# Patient Record
Sex: Male | Born: 1955 | Race: White | Hispanic: No | Marital: Single | Smoking: Former smoker
Health system: Southern US, Community
[De-identification: ages and names within clinical notes are randomized; demographics above are authoritative.]

## PROBLEM LIST (undated history)

## (undated) DIAGNOSIS — F329 Major depressive disorder, single episode, unspecified: Secondary | ICD-10-CM

## (undated) DIAGNOSIS — S060XAA Concussion with loss of consciousness status unknown, initial encounter: Secondary | ICD-10-CM

## (undated) DIAGNOSIS — M1990 Unspecified osteoarthritis, unspecified site: Secondary | ICD-10-CM

## (undated) DIAGNOSIS — I739 Peripheral vascular disease, unspecified: Secondary | ICD-10-CM

## (undated) DIAGNOSIS — S060X9A Concussion with loss of consciousness of unspecified duration, initial encounter: Secondary | ICD-10-CM

## (undated) DIAGNOSIS — R519 Headache, unspecified: Secondary | ICD-10-CM

## (undated) DIAGNOSIS — F419 Anxiety disorder, unspecified: Secondary | ICD-10-CM

## (undated) DIAGNOSIS — R0600 Dyspnea, unspecified: Secondary | ICD-10-CM

## (undated) DIAGNOSIS — R42 Dizziness and giddiness: Secondary | ICD-10-CM

## (undated) DIAGNOSIS — R51 Headache: Secondary | ICD-10-CM

## (undated) DIAGNOSIS — J449 Chronic obstructive pulmonary disease, unspecified: Secondary | ICD-10-CM

## (undated) DIAGNOSIS — F32A Depression, unspecified: Secondary | ICD-10-CM

## (undated) HISTORY — PX: BACK SURGERY: SHX140

## (undated) HISTORY — PX: OTHER SURGICAL HISTORY: SHX169

---

## 1998-03-20 ENCOUNTER — Inpatient Hospital Stay (HOSPITAL_COMMUNITY): Admission: EM | Admit: 1998-03-20 | Discharge: 1998-03-21 | Payer: Self-pay | Admitting: *Deleted

## 1998-03-20 ENCOUNTER — Encounter: Payer: Self-pay | Admitting: *Deleted

## 1999-06-09 ENCOUNTER — Encounter: Payer: Self-pay | Admitting: Orthopedic Surgery

## 1999-06-10 ENCOUNTER — Encounter (INDEPENDENT_AMBULATORY_CARE_PROVIDER_SITE_OTHER): Payer: Self-pay

## 1999-06-10 ENCOUNTER — Observation Stay (HOSPITAL_COMMUNITY): Admission: RE | Admit: 1999-06-10 | Discharge: 1999-06-11 | Payer: Self-pay | Admitting: Orthopedic Surgery

## 1999-06-10 ENCOUNTER — Encounter: Payer: Self-pay | Admitting: Orthopedic Surgery

## 2006-01-25 ENCOUNTER — Ambulatory Visit (HOSPITAL_BASED_OUTPATIENT_CLINIC_OR_DEPARTMENT_OTHER): Admission: RE | Admit: 2006-01-25 | Discharge: 2006-01-25 | Payer: Self-pay | Admitting: Orthopedic Surgery

## 2006-12-19 ENCOUNTER — Encounter: Admission: RE | Admit: 2006-12-19 | Discharge: 2006-12-19 | Payer: Self-pay | Admitting: Orthopedic Surgery

## 2009-06-18 ENCOUNTER — Ambulatory Visit: Payer: Self-pay | Admitting: Psychiatry

## 2010-12-05 NOTE — Op Note (Signed)
NAME:  Benjamin Jenkins, Benjamin Jenkins              ACCOUNT NO.:  [id number]   MEDICAL RECORD NO.:  [id number]          PATIENT TYPE:  AMB   LOCATION:  DSC                          FACILITY:  [facility]   PHYSICIAN:  Feliberto Gottron. Turner Daniels, M.D.   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   PROCEDURE:  Left knee partial arthroscopic medial meniscectomy for a large  bucket-handle tear, debris of chondromalacia near the notch - grade 3 with  flap tears, and removal of loose bodies, and then on the right side, we did  an intra-articular injection of 9 cubic centimeters of 0.5% Marcaine and 80  mg of Depo-Medrol.   SURGEON:  Feliberto Gottron. Turner Daniels, M.D.   FIRST ASSISTANT:  None.   ANESTHETIC:  General LMA.   ESTIMATED BLOOD LOSS:  Minimal.   FLUID REPLACEMENT:  800 cubic centimeters of crystalloid.   DRAINS PLACED:  None.   TOURNIQUET TIME:  None.   INDICATIONS FOR PROCEDURE:  A 55 year old man with catching, popping, and  pain in his left knee.  MRI scan is consistent with a large bucket-handle  tear of the medial meniscus and some chondromalacia.  He has failed  conservative treatment, desires elective arthroscopic evaluation and  treatment of his left knee, and has some chondromalacia in his right knee  and subsequently wants a cortisone shot in the right knee while he is under  the anesthetic and, of course, this is reasonable.  The risks and benefits  of surgery were discussed, questions answered.  He is prepared for  intervention.   DESCRIPTION OF PROCEDURE:  The patient identified by armband, taken the  operating room at [facility].  Appropriate anesthetic monitors  were attached and  general LMA anesthesia induced with the patient in the  supine position.  The lateral aspect of the right knee was then prepped with  alcohol and we injected 9 cubic centimeters of 0.5% Marcaine and 80 mg of  Depo-Medrol intra-articularly into the right knee.  A lateral post was then  applied to the table and the left lower extremity prepped and draped in the  usual sterile fashion from the ankle to the mid thigh, and we began the  procedure on the left side by making standard inferomedial and inferolateral  peripatellar incisions, allowing introduction the arthroscope,  the  inferolateral portal, and the outflow through the inferomedial portal.  The  suprapatellar pouch, patella, and trochlea had some very mild grade 2  chondromalacia incidentally lightly debrided.  The medial gutter was then  cleared.  Moving on the medial side of the knee, we found a large bucket-  handle tear the medial meniscus.  This was removed piecemeal with a 3.5  gator sucker shaver, a 4.2 great white sucker shaver, and straight baskets.  The ACL and PCL were intact  and there were multiple cartilaginous loose  bodies removed as well.  Grade 3 chondromalacia with flap tears of the  medial femoral condyle near the notch was identified and debrided.  The  lateral compartment was in excellent condition and the gutters were cleared  medially and laterally.  At this point, the portals were infiltrated with 2-  3 cubic centimeters of 0.5% Marcaine and another 12 cubic centimeters placed  into the joint itself using a 22-gauge needle.  The arthroscopic instruments  were removed.  A dressing of Xeroform 4x4 dressing sponges, Webril, and an  Ace wrap applied.  The patient was then awakened and taken to the recovery  room without difficulty.      Feliberto Gottron. Turner Daniels, M.D.  Electronically Signed     FJR/MEDQ  D:  01/25/2006  T:  01/25/2006  Job:  [id number]

## 2013-04-14 ENCOUNTER — Emergency Department (HOSPITAL_COMMUNITY)
Admission: EM | Admit: 2013-04-14 | Discharge: 2013-04-14 | Disposition: A | Discharge status: Home / Self Care | Payer: BC Managed Care – PPO | Attending: Emergency Medicine | Admitting: Emergency Medicine

## 2013-04-14 DIAGNOSIS — Z791 Long term (current) use of non-steroidal anti-inflammatories (NSAID): Secondary | ICD-10-CM | POA: Insufficient documentation

## 2013-04-14 DIAGNOSIS — R42 Dizziness and giddiness: Secondary | ICD-10-CM | POA: Insufficient documentation

## 2013-04-14 DIAGNOSIS — Z79899 Other long term (current) drug therapy: Secondary | ICD-10-CM | POA: Insufficient documentation

## 2013-04-14 DIAGNOSIS — H8110 Benign paroxysmal vertigo, unspecified ear: Secondary | ICD-10-CM | POA: Insufficient documentation

## 2013-04-14 LAB — POCT I-STAT, CHEM 8
Chloride: 100 mEq/L (ref 96–112)
Creatinine, Ser: 1 mg/dL (ref 0.50–1.35)
HCT: 41 % (ref 39.0–52.0)
Hemoglobin: 13.9 g/dL (ref 13.0–17.0)
Potassium: 3.7 mEq/L (ref 3.5–5.1)
Sodium: 135 mEq/L (ref 135–145)
TCO2: 24 mmol/L (ref 0–100)

## 2013-04-14 LAB — CBC
HCT: 39.1 % (ref 39.0–52.0)
Hemoglobin: 14.1 g/dL (ref 13.0–17.0)
MCHC: 36.1 g/dL — ABNORMAL HIGH (ref 30.0–36.0)
RBC: 4.2 MIL/uL — ABNORMAL LOW (ref 4.22–5.81)
RDW: 12.3 % (ref 11.5–15.5)
WBC: 5.9 10*3/uL (ref 4.0–10.5)

## 2013-04-14 MED ORDER — MECLIZINE HCL 25 MG PO TABS: 25.0000 mg | ORAL_TABLET | Freq: Three times a day (TID) | ORAL | Status: DC | PRN

## 2013-04-14 NOTE — ED Notes (Signed)
Per EMS: Pt from home with c/o with sudden onset of dizziness, lightheaded at 1330 today with standing. PT AO x4. NSR. CBG 99. 148/94.

## 2013-04-14 NOTE — ED Notes (Signed)
Pt reports dizziness, lightheadedness starting at 1330 today while driving. Worse when standing or moving head from side to side. Denies at this time. Neuro intact. Denies pain.

## 2013-04-14 NOTE — ED Provider Notes (Signed)
CSN: [id number]     Arrival date & time 04/14/13  1633 History   First MD Initiated Contact with Patient 04/14/13 1646     Chief Complaint  Patient presents with  . Near Syncope   (Consider location/radiation/quality/duration/timing/severity/associated sxs/prior Treatment) HPI Comments: 57 yo male with intermittent vertigo since 1330 today, only with head movement.  No hx of similar.  No ha or neck pain.  No neck surgeries.  Sxs resolved in ED.  PT feels normal again.  No stroke hx.  Lasted minutes at a time, intermittent.   The history is provided by the patient.    No past medical history on file. No past surgical history on file. No family history on file. History  Substance Use Topics  . Smoking status: Not on file  . Smokeless tobacco: Not on file  . Alcohol Use: Not on file    Review of Systems  Constitutional: Negative for fever and chills.  HENT: Negative for neck pain and neck stiffness.   Eyes: Negative for visual disturbance.  Respiratory: Negative for shortness of breath.   Cardiovascular: Negative for chest pain.  Gastrointestinal: Negative for vomiting and abdominal pain.  Genitourinary: Negative for dysuria and flank pain.  Musculoskeletal: Negative for back pain.  Skin: Negative for rash.  Neurological: Positive for dizziness. Negative for weakness, light-headedness, numbness and headaches.    Allergies  Review of patient's allergies indicates no known allergies.  Home Medications   Current Outpatient Rx  Name  Route  Sig  Dispense  Refill  . loratadine (CLARITIN) 10 MG tablet   Oral   Take 10 mg by mouth daily.         . meloxicam (MOBIC) 15 MG tablet   Oral   Take 15 mg by mouth daily.         . meclizine (ANTIVERT) 25 MG tablet   Oral   Take 1 tablet (25 mg total) by mouth 3 (three) times daily as needed.   10 tablet   0    BP 136/78  Pulse 66  Temp(Src) 97.2 F (36.2 C) (Oral)  Resp 14  SpO2 98% Physical Exam  Nursing note and  vitals reviewed. Constitutional: He is oriented to person, place, and time. He appears well-developed and well-nourished.  HENT:  Head: Normocephalic and atraumatic.  Eyes: Conjunctivae are normal. Lids are everted and swept, no foreign bodies found. Right eye exhibits no discharge. Left eye exhibits no discharge.  Neck: Normal range of motion. Neck supple. No tracheal deviation present.  Cardiovascular: Normal rate and regular rhythm.   Pulmonary/Chest: Effort normal and breath sounds normal.  Abdominal: Soft. He exhibits no distension. There is no tenderness. There is no guarding.  Musculoskeletal: He exhibits no edema.  Neurological: He is alert and oriented to person, place, and time.  5+ strength in UE and LE with f/e at major joints. Sensation to palpation intact in UE and LE. CNs 2-12 grossly intact.  EOMFI.  PERRL.   Finger nose and coordination intact bilateral.   Visual fields intact to finger testing.   Skin: Skin is warm. No rash noted.  Psychiatric: He has a normal mood and affect.    ED Course  Procedures (including critical care time) Labs Review Labs Reviewed  CBC - Abnormal; Notable for the following:    RBC 4.20 (*)    MCHC 36.1 (*)    All other components within normal limits  POCT I-STAT, CHEM 8 - Abnormal; Notable for the following:  Glucose, Bld 121 (*)    All other components within normal limits   Imaging Review No results found.  MDM   1. BPPV (benign paroxysmal positional vertigo)    Well appearing. HPI consistent with bppv, low risk cardiac and stroke.  No sxs in ed.  EKG done at triage.  Reviewed, lvh. Close fup discussed.  Date: 04/14/2013  Rate: 61  Rhythm: normal sinus rhythm  QRS Axis: normal  Intervals: normal  ST/T Wave abnormalities: nonspecific ST changes  Conduction Disutrbances:?LVH  Narrative Interpretation:   Old EKG Reviewed:unchanged DC    Enid Skeens, MD 04/14/13 [phone]

## 2013-05-12 ENCOUNTER — Ambulatory Visit: Payer: BC Managed Care – PPO

## 2014-11-09 ENCOUNTER — Other Ambulatory Visit: Payer: Self-pay | Admitting: Family Medicine

## 2014-11-09 DIAGNOSIS — M545 Low back pain: Secondary | ICD-10-CM

## 2015-10-11 ENCOUNTER — Other Ambulatory Visit: Payer: Self-pay | Admitting: Orthopedic Surgery

## 2015-10-11 DIAGNOSIS — M25521 Pain in right elbow: Secondary | ICD-10-CM

## 2015-10-18 ENCOUNTER — Ambulatory Visit
Admission: RE | Admit: 2015-10-18 | Discharge: 2015-10-18 | Disposition: A | Discharge status: Home / Self Care | Payer: BLUE CROSS/BLUE SHIELD | Source: Ambulatory Visit | Attending: Orthopedic Surgery | Admitting: Orthopedic Surgery

## 2015-10-18 DIAGNOSIS — M25521 Pain in right elbow: Secondary | ICD-10-CM

## 2016-07-21 ENCOUNTER — Ambulatory Visit (INDEPENDENT_AMBULATORY_CARE_PROVIDER_SITE_OTHER): Payer: 59

## 2016-07-21 ENCOUNTER — Ambulatory Visit (INDEPENDENT_AMBULATORY_CARE_PROVIDER_SITE_OTHER): Payer: 59 | Admitting: Physician Assistant

## 2016-07-21 VITALS — BP 138/70 | HR 77 | Temp 98.1°F | Resp 16 | Ht 69.5 in | Wt 138.2 lb

## 2016-07-21 DIAGNOSIS — R938 Abnormal findings on diagnostic imaging of other specified body structures: Secondary | ICD-10-CM

## 2016-07-21 DIAGNOSIS — R05 Cough: Secondary | ICD-10-CM

## 2016-07-21 DIAGNOSIS — R9389 Abnormal findings on diagnostic imaging of other specified body structures: Secondary | ICD-10-CM

## 2016-07-21 DIAGNOSIS — R059 Cough, unspecified: Secondary | ICD-10-CM

## 2016-07-21 DIAGNOSIS — R197 Diarrhea, unspecified: Secondary | ICD-10-CM | POA: Diagnosis not present

## 2016-07-21 LAB — POCT CBC
Granulocyte percent: 79.8 %G (ref 37–80)
HCT, POC: 47.1 % (ref 43.5–53.7)
Hemoglobin: 16.5 g/dL (ref 14.1–18.1)
LYMPH, POC: 1.4 (ref 0.6–3.4)
MCH: 34.2 pg — AB (ref 27–31.2)
MCHC: 35 g/dL (ref 31.8–35.4)
MCV: 97.6 fL — AB (ref 80–97)
MID (cbc): 0.7 (ref 0–0.9)
MPV: 6.9 fL (ref 0–99.8)
POC Granulocyte: 8.4 — AB (ref 2–6.9)
POC LYMPH PERCENT: 13.6 %L (ref 10–50)
POC MID %: 6.6 %M (ref 0–12)
Platelet Count, POC: 286 10*3/uL (ref 142–424)
RBC: 4.83 M/uL (ref 4.69–6.13)
RDW, POC: 12.5 %
WBC: 10.5 10*3/uL — AB (ref 4.6–10.2)

## 2016-07-21 MED ORDER — ALBUTEROL SULFATE (2.5 MG/3ML) 0.083% IN NEBU
2.5000 mg | INHALATION_SOLUTION | Freq: Once | RESPIRATORY_TRACT | Status: AC
  Administered 2016-07-21: 2.5 mg via RESPIRATORY_TRACT

## 2016-07-21 MED ORDER — HYDROCOD POLST-CPM POLST ER 10-8 MG/5ML PO SUER: 5.0000 mL | Freq: Two times a day (BID) | ORAL | 0 refills | Status: DC | PRN

## 2016-07-21 MED ORDER — AZITHROMYCIN 250 MG PO TABS: ORAL_TABLET | ORAL | 0 refills | Status: DC

## 2016-07-21 MED ORDER — IPRATROPIUM BROMIDE 0.02 % IN SOLN
0.5000 mg | Freq: Once | RESPIRATORY_TRACT | Status: AC
  Administered 2016-07-21: 0.5 mg via RESPIRATORY_TRACT

## 2016-07-21 MED ORDER — ALBUTEROL SULFATE HFA 108 (90 BASE) MCG/ACT IN AERS: 2.0000 | INHALATION_SPRAY | Freq: Four times a day (QID) | RESPIRATORY_TRACT | 0 refills | Status: DC | PRN

## 2016-07-21 MED ORDER — BENZONATATE 100 MG PO CAPS: 100.0000 mg | ORAL_CAPSULE | Freq: Three times a day (TID) | ORAL | 0 refills | Status: DC | PRN

## 2016-07-21 MED ORDER — IPRATROPIUM BROMIDE HFA 17 MCG/ACT IN AERS: 2.0000 | INHALATION_SPRAY | Freq: Four times a day (QID) | RESPIRATORY_TRACT | 0 refills | Status: DC | PRN

## 2016-07-21 MED ORDER — PREDNISONE 20 MG PO TABS: 40.0000 mg | ORAL_TABLET | Freq: Every day | ORAL | 0 refills | Status: AC

## 2016-07-21 NOTE — Patient Instructions (Addendum)
We will cover for bacterial etiology of your cough with a zpack.  I am also concerned that you may have underlying COPD due to your chest xray findings. I am going to refer you to pulmonology and also treat this as a COPD exacerbation. We will use a course of prednisone and two inhalers along with your antibiotic. Return to clinic or ER if symptoms worsen, do not improve in 5-7 days, or as needed    Cough, Adult Introduction A cough helps to clear your throat and lungs. A cough may last only 2-3 weeks (acute), or it may last longer than 8 weeks (chronic). Many different things can cause a cough. A cough may be a sign of an illness or another medical condition. Follow these instructions at home:  Pay attention to any changes in your cough.  Take medicines only as told by your doctor.  If you were prescribed an antibiotic medicine, take it as told by your doctor. Do not stop taking it even if you start to feel better.  Talk with your doctor before you try using a cough medicine.  Drink enough fluid to keep your pee (urine) clear or pale yellow.  If the air is dry, use a cold steam vaporizer or humidifier in your home.  Stay away from things that make you cough at work or at home.  If your cough is worse at night, try using extra pillows to raise your head up higher while you sleep.  Do not smoke, and try not to be around smoke. If you need help quitting, ask your doctor.  Do not have caffeine.  Do not drink alcohol.  Rest as needed. Contact a doctor if:  You have new problems (symptoms).  You cough up yellow fluid (pus).  Your cough does not get better after 2-3 weeks, or your cough gets worse.  Medicine does not help your cough and you are not sleeping well.  You have pain that gets worse or pain that is not helped with medicine.  You have a fever.  You are losing weight and you do not know why.  You have night sweats. Get help right away if:  You cough up  blood.  You have trouble breathing.  Your heartbeat is very fast. This information is not intended to replace advice given to you by your health care provider. Make sure you discuss any questions you have with your health care provider. Document Released: 03/19/2011 Document Revised: 12/12/2015 Document Reviewed: 09/12/2014  2017 Elsevier    IF you received an x-ray today, you will receive an invoice from [facility]. Please contact [facility] at [phone] with questions or concerns regarding your invoice.   IF you received labwork today, you will receive an invoice from [facility]. Please contact [facility] at [phone] with questions or concerns regarding your invoice.   Our billing staff will not be able to assist you with questions regarding bills from these companies.  You will be contacted with the lab results as soon as they are available. The fastest way to get your results is to activate your My Chart account. Instructions are located on the last page of this paperwork. If you have not heard from us regarding the results in 2 weeks, please contact this office.

## 2016-07-21 NOTE — Progress Notes (Signed)
MRN: [id number] DOB: 11/11/1955  Subjective:   Benjamin Jenkins is a 61 y.o. male presenting for chief complaint of Cough (x 2 wks); Sinusitis (x 2 wks); Sore Throat (x 2wks); Epistaxis (when blowing his nose); and Diarrhea . Reports 3 week history of sinus headache, sinus congestion, rhinorrhea, ear fullness, productive cough (no hemoptysis), wheezing and shortness of breath, night sweats, chills and diarrhea. His cough has gotten progressively worse this past week. Has tried cough syrup with expectorant with no relief. Denies sore throat, chest pain and myalgia, nausea, vomiting and abdominal pain. Has had some sick contact with coworkers. Has history of seasonal allergies, no history of asthma or COPD. Patient has had flu shot this season. Denies current smoking. Quit in 1990s.  Drinks 3 beers daily. Denies any other aggravating or relieving factors, no other questions or concerns.  Review of Systems  Constitutional: Negative for malaise/fatigue and weight loss.  Eyes: Negative for pain and discharge.  Cardiovascular: Negative for palpitations and leg swelling.  Gastrointestinal: Negative for blood in stool, constipation and melena.  Genitourinary: Negative for dysuria and frequency.  Musculoskeletal: Negative for joint pain and myalgias.  Skin: Negative for rash.  Neurological: Positive for dizziness ( intermittent, not present today).  Endo/Heme/Allergies: Positive for environmental allergies.   [name] has a current medication list which includes the following prescription(s): acetaminophen and fluticasone propionate. Also has No Known Allergies.  [name]  has no past medical history on file. Also  has no past surgical history on file.    Social History   Social History  . Marital status: Single    Spouse name: N/A  . Number of children: N/A  . Years of education: N/A   Occupational History  . Not on file.   Social History Main Topics  . Smoking status: Former Smoker   Packs/day: 0.50    Years: 20.00    Types: Cigarettes  . Smokeless tobacco: Former NeurosurgeonUser    Quit date: 07/21/1988  . Alcohol use Not on file  . Drug use: Unknown  . Sexual activity: Not on file   Other Topics Concern  . Not on file   Social History Narrative  . No narrative on file    Objective:   Vitals: BP 138/70   Pulse 77   Temp 98.1 F (36.7 C) (Oral)   Resp 16   Ht 5' 9.5" (1.765 m)   Wt 138 lb 3.2 oz (62.7 kg)   SpO2 96%   PF 300 L/min   BMI 20.12 kg/m   Physical Exam  Constitutional: He is oriented to person, place, and time. He appears well-developed and well-nourished. He appears distressed (moderate, consistently coughing throughout exam).  HENT:  Head: Normocephalic and atraumatic.  Right Ear: External ear and ear canal normal. No tenderness. Tympanic membrane is erythematous (  mild) and bulging (  mild).  Left Ear: External ear and ear canal normal. No tenderness. Tympanic membrane is erythematous ( mild) and bulging (  mild).  Nose: Rhinorrhea present. Right sinus exhibits no maxillary sinus tenderness and no frontal sinus tenderness. Left sinus exhibits no maxillary sinus tenderness and no frontal sinus tenderness.  Mouth/Throat: Uvula is midline and mucous membranes are normal. Posterior oropharyngeal erythema present. No tonsillar exudate.  Eyes: Conjunctivae are normal.  Neck: Normal range of motion.  Cardiovascular: Normal rate, regular rhythm and normal heart sounds.   Pulmonary/Chest: Effort normal.  Lung exam difficult due to excessive coughing by patient. Rhonchi auscultated on posterior lung  fields, cleared with cough. Minimal expiratory  wheezing auscultated.    Barrel chest noted.   Abdominal: Soft. Normal appearance and bowel sounds are normal. There is no tenderness.  Lymphadenopathy:       Head (right side): No submental, no submandibular, no tonsillar, no preauricular, no posterior auricular and no occipital adenopathy present.       Head  (left side): No submental, no submandibular, no tonsillar, no preauricular, no posterior auricular and no occipital adenopathy present.    He has no cervical adenopathy.       Right: No supraclavicular adenopathy present.       Left: No supraclavicular adenopathy present.  Neurological: He is alert and oriented to person, place, and time.  Skin: Skin is warm and dry.  Psychiatric: He has a normal mood and affect.  Vitals reviewed.   Results for orders placed or performed in visit on 07/21/16 (from the past 24 hour(s))  POCT CBC     Status: Abnormal   Collection Time: 07/21/16 10:32 AM  Result Value Ref Range   WBC 10.5 (A) 4.6 - 10.2 K/uL   Lymph, poc 1.4 0.6 - 3.4   POC LYMPH PERCENT 13.6 10 - 50 %L   MID (cbc) 0.7 0 - 0.9   POC MID % 6.6 0 - 12 %M   POC Granulocyte 8.4 (A) 2 - 6.9   Granulocyte percent 79.8 37 - 80 %G   RBC 4.83 4.69 - 6.13 M/uL   Hemoglobin 16.5 14.1 - 18.1 g/dL   HCT, POC 16.1 09.6 - 53.7 %   MCV 97.6 (A) 80 - 97 fL   MCH, POC 34.2 (A) 27 - 31.2 pg   MCHC 35.0 31.8 - 35.4 g/dL   RDW, POC 04.5 %   Platelet Count, POC 286 142 - 424 K/uL   MPV 6.9 0 - 99.8 fL    Dg Chest 2 View  Result Date: 07/21/2016 CLINICAL DATA:  Cough for 3 weeks. EXAM: CHEST  2 VIEW COMPARISON:  05/20/2010 FINDINGS: Heart size and pulmonary vascularity are normal. Tortuosity of the thoracic aorta. No infiltrates or effusions. The lungs appear somewhat hyperinflated which could be due to emphysema or a deep inspiration. IMPRESSION: Hyperinflated lungs. Tortuous aorta. Does the patient have hypertension? Otherwise normal exam. Electronically Signed   By: Francene Boyers M.D.   On: 07/21/2016 10:10   Peak flow reading pre breathing treatment was 275, post breathing treatment is 300 , about 57% of predicted. Pt reports relief with breathing treatment.   Assessment and Plan :  1. Diarrhea, unspecified type - Stool culture  2. Cough Question underlying COPD due to physical exam findings,  smoking history, and CXR findings. Will treat as COPD exacerbation and give pt a pulmonology referral for further workup. Pt instructed to return to clinic if symptoms worsen, do not improve in 5-7 days, or as needed - POCT CBC - albuterol (PROVENTIL) (2.5 MG/3ML) 0.083% nebulizer solution 2.5 mg; Take 3 mLs (2.5 mg total) by nebulization once. - ipratropium (ATROVENT) nebulizer solution 0.5 mg; Take 2.5 mLs (0.5 mg total) by nebulization once. - DG Chest 2 View; Future - azithromycin (ZITHROMAX) 250 MG tablet; Take 2 tabs PO x 1 dose, then 1 tab PO QD x 4 days  Dispense: 6 tablet; Refill: 0 - chlorpheniramine-HYDROcodone (TUSSIONEX PENNKINETIC ER) 10-8 MG/5ML SUER; Take 5 mLs by mouth every 12 (twelve) hours as needed for cough.  Dispense: 100 mL; Refill: 0 - benzonatate (TESSALON) 100 MG capsule; Take  1-2 capsules (100-200 mg total) by mouth 3 (three) times daily as needed for cough.  Dispense: 40 capsule; Refill: 0 - albuterol (PROVENTIL HFA;VENTOLIN HFA) 108 (90 Base) MCG/ACT inhaler; Inhale 2 puffs into the lungs every 6 (six) hours as needed for wheezing or shortness of breath.  Dispense: 1 Inhaler; Refill: 0 - Ambulatory referral to Pulmonology - ipratropium (ATROVENT HFA) 17 MCG/ACT inhaler; Inhale 2 puffs into the lungs every 6 (six) hours as needed for wheezing.  Dispense: 1 Inhaler; Refill: 0 - predniSONE (DELTASONE) 20 MG tablet; Take 2 tablets (40 mg total) by mouth daily with breakfast.  Dispense: 10 tablet; Refill: 0  3. Abnormal chest x-ray -Due to CXR findings of tortuous aorta with no PMH of HTN or heart disease, will refer to cardio for further workup.  - Ambulatory referral to Cardiology  Benjiman Core, PA-C  [facility] 07/21/2016 10:35 AM

## 2016-07-23 ENCOUNTER — Telehealth: Payer: Self-pay

## 2016-07-23 NOTE — Telephone Encounter (Signed)
PATIENT WAS SEEN ON Tuesday 07/21/16 BY BRITTANY WISEMAN FOR A COUGH. SHE GAVE HIM AN OUT OF WORK NOTE TO RETURN BACK TO WORK ON Friday 07/24/2016. HE SAID HE IS STILL NOT SLEEPING WELL DUE TO THE COUGH AND HE STILL HAS A HEADACHE. THE MEDICINE IS HELPING SOME BUT HE WOULD LIKE TO GET A NEW NOTE TO RETURN TO WORK ON Monday 07/27/2016. BEST PHONE [phone] (CELL)  [facility]

## 2016-07-24 ENCOUNTER — Ambulatory Visit (INDEPENDENT_AMBULATORY_CARE_PROVIDER_SITE_OTHER): Payer: 59 | Admitting: Physician Assistant

## 2016-07-24 VITALS — BP 128/80 | HR 92 | Temp 98.5°F | Resp 17 | Ht 69.5 in | Wt 138.0 lb

## 2016-07-24 DIAGNOSIS — R05 Cough: Secondary | ICD-10-CM

## 2016-07-24 DIAGNOSIS — R059 Cough, unspecified: Secondary | ICD-10-CM

## 2016-07-24 NOTE — Telephone Encounter (Signed)
Pt is currently in office

## 2016-07-24 NOTE — Telephone Encounter (Signed)
That is perfectly okay! If he starts to feel worse though please tell him to come back. Can you please send me a note? Thanks !

## 2016-07-24 NOTE — Progress Notes (Signed)
    MRN: [id number] DOB: [birth year]  Subjective:   Benjamin Jenkins is a 61 y.o. male presenting for chief complaint of Follow-up (cough congestion ) Pt initially seen on 07/21/16 questionable underlying mild COPD exacerbation. Pt treated with breathing treatment in office and given albuterol and atrovent inhalers, prednisone taper, zpack, and symptomatic treatment for cough. He was also given referral to pulmonology and cardiology due to CXR findings of hyperinflation with no known diagnosis of COPD and tortuous aorta.   Today, pt notes he has been taking all medications that were sent to pharmacy except one inhaler because they were out of stock. He did not pick up tussionex because he did not have any more money to get it. Notes his appetite and diarrhea have both improved significantly.  His cough, wheezing, and SOB have also improved although his cough is still present. He notes he feels better but can tell he is still sick. His biggest complaint is not getting enough sleep at night due to his cough. He is here to have an extension on his work note provided on 07/21/16.   Benjamin Jenkins has a current medication list which includes the following prescription(s): acetaminophen, albuterol, azithromycin, benzonatate, chlorpheniramine-hydrocodone, fluticasone propionate, ipratropium, and prednisone. Also has No Known Allergies.  Benjamin Jenkins  has no past medical history on file. Also  has no past surgical history on file.   Objective:   Vitals: BP 128/80 (BP Location: Right Arm, Patient Position: Sitting, Cuff Size: Normal)   Pulse 92   Temp 98.5 F (36.9 C) (Oral)   Resp 17   Ht 5' 9.5" (1.765 m)   Wt 138 lb (62.6 kg)   SpO2 97%   BMI 20.09 kg/m   Physical Exam  Constitutional: He is oriented to person, place, and time. He appears well-developed and well-nourished. He appears distressed (mild, appears uncomfortable).  HENT:  Head: Normocephalic and atraumatic.  Right Ear: External ear and ear canal  normal. A middle ear effusion is present.  Left Ear: Tympanic membrane, external ear and ear canal normal.  Eyes: Conjunctivae are normal.  Neck: Normal range of motion.  Cardiovascular: Normal rate, regular rhythm and normal heart sounds.   Pulmonary/Chest: Effort normal.  Course breath sounds in posterior lung fields. No wheezes auscultated.   Lymphadenopathy:       Head (right side): No submental, no submandibular, no tonsillar, no preauricular, no posterior auricular and no occipital adenopathy present.       Head (left side): No submental, no submandibular, no tonsillar, no preauricular, no posterior auricular and no occipital adenopathy present.    He has no cervical adenopathy.       Right: No supraclavicular adenopathy present.       Left: No supraclavicular adenopathy present.  Neurological: He is alert and oriented to person, place, and time.  Skin: Skin is warm and dry.  Psychiatric: He has a normal mood and affect.  Vitals reviewed.   No results found for this or any previous visit (from the past 24 hour(s)).  Assessment and Plan :  1. Cough Improving. Instructed to continue medications as prescribed and also encouraged to pick up the tussionex as this will help with sleep at night. Pt instructed to return to clinic if symptoms worsen, do not improve in 7-10 days, or as needed  Benjiman CoreBrittany Damire Remedios, PA-C [facility] [phone] 07/24/2016 11:42 AM

## 2016-07-24 NOTE — Patient Instructions (Addendum)
  Continue taking medications as prescribed. Pick up the tussionex as this will help with sleep. If no improvement in one week, return to clinic.   Thank you for letting me participate in your health and well being.   IF you received an x-ray today, you will receive an invoice from [facility]. Please contact [facility] at [phone] with questions or concerns regarding your invoice.   IF you received labwork today, you will receive an invoice from [facility]. Please contact [facility] at [phone] with questions or concerns regarding your invoice.   Our billing staff will not be able to assist you with questions regarding bills from these companies.  You will be contacted with the lab results as soon as they are available. The fastest way to get your results is to activate your My Chart account. Instructions are located on the last page of this paperwork. If you have not heard from us regarding the results in 2 weeks, please contact this office.

## 2016-07-28 ENCOUNTER — Telehealth: Payer: Self-pay | Admitting: Family Medicine

## 2016-07-28 NOTE — Telephone Encounter (Addendum)
Pt calling to see if its ok to take otc vitamins and ibuprofen with the indoor outdoor allergy med and it want affect anything please call pt

## 2016-07-29 NOTE — Telephone Encounter (Signed)
Yes, it should be fine to take OTC vitamins, ibuprofen, and allergy medication. If he ever has any further questions about OTC medications, he can always go ask the pharmacist at any [facility], [facility], etc. Thanks!

## 2017-01-20 ENCOUNTER — Encounter: Payer: Self-pay | Admitting: Cardiology

## 2017-02-08 ENCOUNTER — Encounter: Payer: Self-pay | Admitting: Cardiology

## 2017-02-08 ENCOUNTER — Ambulatory Visit (INDEPENDENT_AMBULATORY_CARE_PROVIDER_SITE_OTHER): Payer: 59 | Admitting: Cardiology

## 2017-02-08 VITALS — BP 114/74 | HR 68 | Ht 70.0 in | Wt 138.0 lb

## 2017-02-08 DIAGNOSIS — Z720 Tobacco use: Secondary | ICD-10-CM | POA: Insufficient documentation

## 2017-02-08 DIAGNOSIS — Z87891 Personal history of nicotine dependence: Secondary | ICD-10-CM | POA: Diagnosis not present

## 2017-02-08 DIAGNOSIS — R7989 Other specified abnormal findings of blood chemistry: Secondary | ICD-10-CM | POA: Diagnosis not present

## 2017-02-08 DIAGNOSIS — R0602 Shortness of breath: Secondary | ICD-10-CM | POA: Diagnosis not present

## 2017-02-08 DIAGNOSIS — J449 Chronic obstructive pulmonary disease, unspecified: Secondary | ICD-10-CM | POA: Insufficient documentation

## 2017-02-08 NOTE — Patient Instructions (Addendum)
Medication Instructions:  Your physician recommends that you continue on your current medications as directed. Please refer to the Current Medication list given to you today.   Labwork: Your physician recommends that you return for lab work in: today. D-dimer   Testing/Procedures: Your physician has requested that you have en exercise stress myoview. For further information please visit [email or web address]. Please follow instruction sheet, as given.  You had an EKG today.  Follow-Up: Your physician recommends that you schedule a follow-up appointment in: 1 month   Any Other Special Instructions Will Be Listed Below (If Applicable).     If you need a refill on your cardiac medications before your next appointment, please call your pharmacy.

## 2017-02-08 NOTE — Progress Notes (Addendum)
Cardiology Office Note:    Date:  02/08/2017   ID:  Benjamin Jenkins, DOB 1955/11/01, MRN [id number]  PCP:  Patient, No Pcp Per  Cardiologist:  Garwin Brothers, MD   Referring MD: Benjiman Core D, PA*    ASSESSMENT:    1. SOB (shortness of breath)   2. Ex-smoker    PLAN:    In order of problems listed above:  1. I discussed my findings with the patient at extensive length and reassured him. He had one episode of shortness of breath. At that time he was not doing anything unusual. Subsequently history not done much and leads a sedentary extremity. My evaluation reveals him to be in no distress and comfortable. In view of the symptoms I we'll set him up for exercise stress Cardiolite. Further recommendations will be made based on the findings of the test. He knows to go to the nearest emergency room for any concerning symptoms. In view of his history of shortness of breath, I we'll obtain a d-dimer. I also urged him never to go back to smoking and he verbalized understanding. 2. He mentions to me that his chest x-ray was abnormal. I mentioned to him that this will be followed by his primary care provider. If there is any evidence of vascular calcification then he needs to be on aggressive therapy such as statin therapy. I will leave this to the decision of his primary care physician.   Medication Adjustments/Labs and Tests Ordered: Current medicines are reviewed at length with the patient today.  Concerns regarding medicines are outlined above.  No orders of the defined types were placed in this encounter.  No orders of the defined types were placed in this encounter.    History of Present Illness:    Benjamin Jenkins is a 61 y.o. male who is being seen today for the evaluation of Shortness of breath at the request of Magdalene [place], [place]*. Patient has no significant past medical history. He mentions to me that a couple of days ago he felt short of breath. He was concerned  about this. He went to the primary care physician who referred him here. He mentions to me that he does not have any issues with exertion though he has not been to walk and laid low since the past several days after the symptoms. No chest pain orthopnea or PND. At the time of my evaluation he is alert awake oriented and in no distress.  History reviewed. No pertinent past medical history.  History reviewed. No pertinent surgical history.  Current Medications: Current Meds  Medication Sig  . ACETAMINOPHEN PO Take 1 tablet by mouth daily as needed.   Marland Kitchen albuterol (PROVENTIL HFA;VENTOLIN HFA) 108 (90 Base) MCG/ACT inhaler Inhale 2 puffs into the lungs every 6 (six) hours as needed for wheezing or shortness of breath.  . Fluticasone Propionate (FLONASE NA) Place 1 spray into the nose daily as needed.   Marland Kitchen ipratropium (ATROVENT HFA) 17 MCG/ACT inhaler Inhale 2 puffs into the lungs every 6 (six) hours as needed for wheezing.     Allergies:   Patient has no known allergies.   Social History   Social History  . Marital status: Single    Spouse name: N/A  . Number of children: N/A  . Years of education: N/A   Social History Main Topics  . Smoking status: Former Smoker    Packs/day: 0.50    Years: 20.00    Types: Cigarettes  .  Smokeless tobacco: Former NeurosurgeonUser    Quit date: 07/21/1988  . Alcohol use 0.6 - 1.8 oz/week    1 - 3 Cans of beer per week     Comment: nightly  . Drug use: No  . Sexual activity: Not Asked   Other Topics Concern  . None   Social History Narrative  . None     Family History: The patient's family history includes Heart disease in his mother.  ROS:   Please see the history of present illness.    All other systems reviewed and are negative.  EKGs/Labs/Other Studies Reviewed:    The following studies were reviewed today: I reviewed the records from primary care physician's office. EKG done reveals sinus rhythm. Bradycardia and nonspecific ST-T  changes.   Recent Labs: 07/21/2016: Hemoglobin 16.5  Recent Lipid Panel No results found for: CHOL, TRIG, HDL, CHOLHDL, VLDL, LDLCALC, LDLDIRECT  Physical Exam:    VS:  BP 114/74   Pulse 68   Ht 5\' 10"  (1.778 m)   Wt 138 lb (62.6 kg)   SpO2 98%   BMI 19.80 kg/m     Wt Readings from Last 3 Encounters:  02/08/17 138 lb (62.6 kg)  07/24/16 138 lb (62.6 kg)  07/21/16 138 lb 3.2 oz (62.7 kg)     GEN: Patient is in no acute distress HEENT: Normal NECK: No JVD; No carotid bruits LYMPHATICS: No lymphadenopathy CARDIAC: S1 S2 regular, 2/6 systolic murmur at the apex. RESPIRATORY:  Clear to auscultation without rales, wheezing or rhonchi  ABDOMEN: Soft, non-tender, non-distended MUSCULOSKELETAL:  No edema; No deformity  SKIN: Warm and dry NEUROLOGIC:  Alert and oriented x 3 PSYCHIATRIC:  Normal affect    Signed, Garwin Brothersajan R Cohen Doleman, MD  02/08/2017 1:55 PM    [facility]

## 2017-02-09 ENCOUNTER — Other Ambulatory Visit: Payer: 59 | Admitting: *Deleted

## 2017-02-09 ENCOUNTER — Ambulatory Visit (INDEPENDENT_AMBULATORY_CARE_PROVIDER_SITE_OTHER)
Admission: RE | Admit: 2017-02-09 | Discharge: 2017-02-09 | Disposition: A | Discharge status: Home / Self Care | Payer: 59 | Source: Ambulatory Visit | Attending: Cardiology | Admitting: Cardiology

## 2017-02-09 ENCOUNTER — Other Ambulatory Visit: Payer: Self-pay

## 2017-02-09 DIAGNOSIS — R0602 Shortness of breath: Secondary | ICD-10-CM

## 2017-02-09 DIAGNOSIS — R7989 Other specified abnormal findings of blood chemistry: Secondary | ICD-10-CM | POA: Diagnosis not present

## 2017-02-09 LAB — BASIC METABOLIC PANEL
BUN/Creatinine Ratio: 16 (ref 9–20)
BUN: 14 mg/dL (ref 6–24)
CO2: 28 mmol/L (ref 20–29)
CREATININE: 0.89 mg/dL (ref 0.76–1.27)
Calcium: 9.4 mg/dL (ref 8.7–10.2)
Chloride: 105 mmol/L (ref 96–106)
GFR calc Af Amer: 109 mL/min/{1.73_m2} (ref >59)
GFR, EST NON AFRICAN AMERICAN: 94 mL/min/{1.73_m2} (ref >59)
Glucose: 99 mg/dL (ref 65–99)
Potassium: 3.8 mmol/L (ref 3.5–5.2)
Sodium: 139 mmol/L (ref 134–144)

## 2017-02-09 LAB — D-DIMER, QUANTITATIVE: D-DIMER: 2.41 mg/L FEU — ABNORMAL HIGH (ref 0.00–0.49)

## 2017-02-09 MED ORDER — IOPAMIDOL (ISOVUE-370) INJECTION 76%
80.0000 mL | Freq: Once | INTRAVENOUS | Status: AC | PRN
  Administered 2017-02-09: 80 mL via INTRAVENOUS

## 2017-02-09 NOTE — Addendum Note (Signed)
Addended by: Sharene ButtersWELLS, Zacchary Pompei E on: 02/09/2017 08:45 AM   Modules accepted: Orders

## 2017-02-09 NOTE — Addendum Note (Signed)
Addended by: Sharene ButtersWELLS, Pearla Mckinny E on: 02/09/2017 08:34 AM   Modules accepted: Orders

## 2017-02-09 NOTE — Addendum Note (Signed)
Addended by: Sharene ButtersWELLS, MICHAELA E on: 02/09/2017 08:48 AM   Modules accepted: Orders

## 2017-02-11 ENCOUNTER — Telehealth (HOSPITAL_COMMUNITY): Payer: Self-pay | Admitting: *Deleted

## 2017-02-11 NOTE — Telephone Encounter (Signed)
Left message on voicemail per DPR in reference to upcoming appointment scheduled on 02/16/17 with detailed instructions given per Myocardial Perfusion Study Information Sheet for the test. LM to arrive 15 minutes early, and that it is imperative to arrive on time for appointment to keep from having the test rescheduled. If you need to cancel or reschedule your appointment, please call the office within 24 hours of your appointment. Failure to do so may result in a cancellation of your appointment, and a $50 no show fee. Phone number given for call back for any questions. Benjamin Jenkins Jacqueline   

## 2017-02-16 ENCOUNTER — Ambulatory Visit (HOSPITAL_COMMUNITY): Payer: 59 | Attending: Cardiology

## 2017-02-16 DIAGNOSIS — R9439 Abnormal result of other cardiovascular function study: Secondary | ICD-10-CM | POA: Diagnosis not present

## 2017-02-16 DIAGNOSIS — I2510 Atherosclerotic heart disease of native coronary artery without angina pectoris: Secondary | ICD-10-CM | POA: Insufficient documentation

## 2017-02-16 DIAGNOSIS — R0602 Shortness of breath: Secondary | ICD-10-CM | POA: Diagnosis present

## 2017-02-16 LAB — MYOCARDIAL PERFUSION IMAGING
CHL CUP NUCLEAR SSS: 11
CSEPED: 10 min
CSEPEW: 12.5 METS
Exercise duration (sec): 30 s
LHR: 0.25
LV dias vol: 101 mL (ref 62–150)
LV sys vol: 49 mL
MPHR: 162 {beats}/min
Peak HR: 142 {beats}/min
Percent HR: 87 %
RPE: 16
Rest HR: 53 {beats}/min
SDS: 5
SRS: 7
TID: 0.92

## 2017-02-16 MED ORDER — TECHNETIUM TC 99M TETROFOSMIN IV KIT
11.0000 | PACK | Freq: Once | INTRAVENOUS | Status: AC | PRN
  Administered 2017-02-16: 11 via INTRAVENOUS
  Filled 2017-02-16: qty 11

## 2017-02-16 MED ORDER — TECHNETIUM TC 99M TETROFOSMIN IV KIT
32.3000 | PACK | Freq: Once | INTRAVENOUS | Status: AC | PRN
  Administered 2017-02-16: 32.3 via INTRAVENOUS
  Filled 2017-02-16: qty 33

## 2017-02-17 ENCOUNTER — Encounter: Payer: Self-pay | Admitting: Cardiology

## 2017-02-17 ENCOUNTER — Ambulatory Visit (INDEPENDENT_AMBULATORY_CARE_PROVIDER_SITE_OTHER): Payer: 59 | Admitting: Cardiology

## 2017-02-17 VITALS — BP 110/76 | HR 69 | Ht 70.0 in | Wt 137.1 lb

## 2017-02-17 DIAGNOSIS — R9439 Abnormal result of other cardiovascular function study: Secondary | ICD-10-CM | POA: Diagnosis not present

## 2017-02-17 DIAGNOSIS — Z87891 Personal history of nicotine dependence: Secondary | ICD-10-CM | POA: Diagnosis not present

## 2017-02-17 DIAGNOSIS — I2510 Atherosclerotic heart disease of native coronary artery without angina pectoris: Secondary | ICD-10-CM | POA: Diagnosis not present

## 2017-02-17 DIAGNOSIS — R0602 Shortness of breath: Secondary | ICD-10-CM | POA: Diagnosis not present

## 2017-02-17 MED ORDER — NITROGLYCERIN 0.4 MG SL SUBL: 0.4000 mg | SUBLINGUAL_TABLET | SUBLINGUAL | 11 refills | Status: DC | PRN

## 2017-02-17 NOTE — Patient Instructions (Signed)
Medication Instructions:  Your physician has recommended you make the following change in your medication:  START nitroglycerin 0.4 mg sublingual (under the tongue) as needed for chest pain. When having chest pain, stop what you are doing and sit down. Take 1 nitro, wait 5 minutes. Still having chest pain, take 1 nitro, wait 5 minutes. Still having chest pain, take 1 nitro, dial 911. Total of 3 nitro in 15 minutes.   Labwork: Your physician recommends that you return for lab work tomorrow. CBC, CMP, lipid   Testing/Procedures: Your physician has requested that you have a cardiac catheterization. Cardiac catheterization is used to diagnose and/or treat various heart conditions. Doctors may recommend this procedure for a number of different reasons. The most common reason is to evaluate chest pain. Chest pain can be a symptom of coronary artery disease (CAD), and cardiac catheterization can show whether plaque is narrowing or blocking your heart's arteries. This procedure is also used to evaluate the valves, as well as measure the blood flow and oxygen levels in different parts of your heart. For further information please visit [email or web address]. Please follow instruction sheet, as given.    Follow-Up: Your physician recommends that you schedule a follow-up appointment in: 1 week after your cath.   Any Other Special Instructions Will Be Listed Below (If Applicable).     If you need a refill on your cardiac medications before your next appointment, please call your pharmacy.

## 2017-02-17 NOTE — Addendum Note (Signed)
Addended by: Ayesha MohairWELLS, Numair Masden E on: 02/17/2017 03:43 PM   Modules accepted: Orders

## 2017-02-17 NOTE — Progress Notes (Signed)
Cardiology Office Note:    Date:  02/17/2017   ID:  Benjamin Blossomavid K Jenkins, DOB 09-17-1955, MRN [id number]  PCP:  Patient, No Pcp Per  Cardiologist:  Garwin Brothersajan R Monserat Prestigiacomo, MD   Referring MD: No ref. provider found    ASSESSMENT:    1. SOB (shortness of breath)   2. Ex-smoker   3. Abnormal nuclear stress test   4. Coronary artery disease involving native coronary artery of native heart without angina pectoris    PLAN:    In order of problems listed above:  1. In view of the patient's significant symptoms and abnormal stress test I discussed coronary angiography and left heart catheterization with the patient at extensive length. Procedure, benefits and potential risks were explained. Patient had multiple questions which were answered to the patient's satisfaction. Patient agreed and consented for the procedure. Further recommendations will be made based on the findings of the coronary angiography. In the interim. The patient has any significant symptoms he knows to go to the nearest emergency room. 2. Patient has been given a letter to be off work until further notice. I told him not to exert himself physically mentally of sexually daily his coronary angiography was done and he was given clear instructions at that time. He agrees. He will continue to take full-strength coated aspirin. He knows to go to the nearest emergency room for any significant concerns. For his precath blood work we will also assess his lipids and liver check and that will help us initiate him on statins now that we know based on the CT scan reports that he has established coronary artery disease.   Medication Adjustments/Labs and Tests Ordered: Current medicines are reviewed at length with the patient today.  Concerns regarding medicines are outlined above.  No orders of the defined types were placed in this encounter.  No orders of the defined types were placed in this encounter.    Chief Complaint  Patient presents  with  . Follow-up    abnormal Nuclear test.      History of Present Illness:    Benjamin Jenkins is a 61 y.o. male. He was evaluated by me for shortness of breath on exertion. His symptoms were significant. He underwent stress testing. This revealed significant abnormality and the stress test is intermediate risk. CT scan of the chest was unremarkable and negative for pulmonary embolism. Patient continues to complains of shortness of breath on exertion. 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 With activities of daily living he has no problems  History reviewed. No pertinent past medical history.  History reviewed. No pertinent surgical history.  Current Medications: Current Meds  Medication Sig  . ACETAMINOPHEN PO Take 1 tablet by mouth daily as needed.   Marland Kitchen. albuterol (PROVENTIL HFA;VENTOLIN HFA) 108 (90 Base) MCG/ACT inhaler Inhale 2 puffs into the lungs every 6 (six) hours as needed for wheezing or shortness of breath.  . Fluticasone Propionate (FLONASE NA) Place 1 spray into the nose daily as needed.   Marland Kitchen. ipratropium (ATROVENT HFA) 17 MCG/ACT inhaler Inhale 2 puffs into the lungs every 6 (six) hours as needed for wheezing.  . Omega-3 Fatty Acids (FISH OIL) 1000 MG CAPS Take 1 capsule by mouth daily.  . TURMERIC PO Take by mouth.     Allergies:   Patient has no known allergies.   Social History   Social History  . Marital status: Single    Spouse name: N/A  . Number of  children: N/A  . Years of education: N/A   Social History Main Topics  . Smoking status: Former Smoker    Packs/day: 0.50    Years: 20.00    Types: Cigarettes  . Smokeless tobacco: Former NeurosurgeonUser    Quit date: 07/21/1988  . Alcohol use 0.6 - 1.8 oz/week    1 - 3 Cans of beer per week     Comment: nightly  . Drug use: No  . Sexual activity: Not Asked   Other Topics Concern  . None   Social History Narrative  . None     Family History: The patient's family history includes Heart disease in  his mother.  ROS:   Please see the history of present illness.    All other systems reviewed and are negative.  EKGs/Labs/Other Studies Reviewed:    The following studies were reviewed today: I reviewed the CT scan of the chest report and stress test report with the patient at extensive length. Questions were answered to his satisfaction.   Recent Labs: 07/21/2016: Hemoglobin 16.5 02/09/2017: BUN 14; Creatinine, Ser 0.89; Potassium 3.8; Sodium 139  Recent Lipid Panel No results found for: CHOL, TRIG, HDL, CHOLHDL, VLDL, LDLCALC, LDLDIRECT  Physical Exam:    VS:  BP 110/76   Pulse 69   Ht 5\' 10"  (1.778 m)   Wt 137 lb 1.9 oz (62.2 kg)   SpO2 98%   BMI 19.67 kg/m     Wt Readings from Last 3 Encounters:  02/17/17 137 lb 1.9 oz (62.2 kg)  02/08/17 138 lb (62.6 kg)  07/24/16 138 lb (62.6 kg)     GEN: Patient is in no acute distress HEENT: Normal NECK: No JVD; No carotid bruits LYMPHATICS: No lymphadenopathy CARDIAC: Hear sounds regular, 2/6 systolic murmur at the apex. RESPIRATORY:  Clear to auscultation without rales, wheezing or rhonchi  ABDOMEN: Soft, non-tender, non-distended MUSCULOSKELETAL:  No edema; No deformity  SKIN: Warm and dry NEUROLOGIC:  Alert and oriented x 3 PSYCHIATRIC:  Normal affect   Signed, Garwin Brothersajan R Kasiya Burck, MD  02/17/2017 3:26 PM    [facility]

## 2017-02-18 ENCOUNTER — Other Ambulatory Visit: Payer: Self-pay | Admitting: Cardiology

## 2017-02-20 LAB — LIPID PANEL
Chol/HDL Ratio: 2.4 ratio (ref 0.0–5.0)
Cholesterol, Total: 150 mg/dL (ref 100–199)
HDL: 63 mg/dL (ref >39)
LDL CALC: 77 mg/dL (ref 0–99)
TRIGLYCERIDES: 52 mg/dL (ref 0–149)
VLDL Cholesterol Cal: 10 mg/dL (ref 5–40)

## 2017-02-22 ENCOUNTER — Telehealth: Payer: Self-pay | Admitting: Cardiology

## 2017-02-22 NOTE — Telephone Encounter (Signed)
Wed, Thurs, Friday is good for him

## 2017-02-22 NOTE — Telephone Encounter (Signed)
Patient advised that cath has been rescheduled for 02/25/17 at 10:30 am and to arrive at 8 am with Dr. Tresa EndoKelly. No further questions.

## 2017-02-22 NOTE — Telephone Encounter (Signed)
Called back again-

## 2017-02-23 ENCOUNTER — Encounter (HOSPITAL_COMMUNITY): Admission: RE | Payer: Self-pay | Source: Ambulatory Visit

## 2017-02-23 ENCOUNTER — Ambulatory Visit (HOSPITAL_COMMUNITY): Admission: RE | Admit: 2017-02-23 | Payer: 59 | Source: Ambulatory Visit | Admitting: Cardiology

## 2017-02-23 ENCOUNTER — Telehealth: Payer: Self-pay

## 2017-02-23 SURGERY — LEFT HEART CATH AND CORONARY ANGIOGRAPHY
Anesthesia: LOCAL

## 2017-02-23 NOTE — Telephone Encounter (Signed)
Left detailed message per DPR.  Patient [facility] scheduled for:  02/25/2017 @ 1030 Verified arrival time and place:  NT @ 0800-gave directions to admissions Confirmed AM meds to be taken pre-cath with sip of water: Take ASA prior to arrival Patient must have responsible person to drive home post procedure and observe patient for 24 hours  Addl concerns:  Left this nurse name and # for call back if any questions/concerns

## 2017-02-25 ENCOUNTER — Encounter (HOSPITAL_COMMUNITY): Payer: Self-pay | Admitting: Cardiovascular Disease

## 2017-02-25 ENCOUNTER — Encounter (HOSPITAL_COMMUNITY)
Admission: RE | Disposition: A | Discharge status: Home / Self Care | Payer: Self-pay | Source: Ambulatory Visit | Attending: Cardiovascular Disease

## 2017-02-25 ENCOUNTER — Ambulatory Visit (HOSPITAL_COMMUNITY)
Admission: RE | Admit: 2017-02-25 | Discharge: 2017-02-25 | Disposition: A | Discharge status: Home / Self Care | Payer: 59 | Source: Ambulatory Visit | Attending: Cardiovascular Disease | Admitting: Cardiovascular Disease

## 2017-02-25 ENCOUNTER — Other Ambulatory Visit: Payer: Self-pay

## 2017-02-25 DIAGNOSIS — R9439 Abnormal result of other cardiovascular function study: Secondary | ICD-10-CM | POA: Diagnosis present

## 2017-02-25 DIAGNOSIS — Z87891 Personal history of nicotine dependence: Secondary | ICD-10-CM | POA: Diagnosis not present

## 2017-02-25 DIAGNOSIS — Z79899 Other long term (current) drug therapy: Secondary | ICD-10-CM | POA: Insufficient documentation

## 2017-02-25 DIAGNOSIS — Z7952 Long term (current) use of systemic steroids: Secondary | ICD-10-CM | POA: Insufficient documentation

## 2017-02-25 DIAGNOSIS — R0602 Shortness of breath: Secondary | ICD-10-CM | POA: Insufficient documentation

## 2017-02-25 DIAGNOSIS — I2510 Atherosclerotic heart disease of native coronary artery without angina pectoris: Secondary | ICD-10-CM | POA: Diagnosis not present

## 2017-02-25 HISTORY — PX: LEFT HEART CATH AND CORONARY ANGIOGRAPHY: CATH118249

## 2017-02-25 LAB — PROTIME-INR
INR: 0.99
PROTHROMBIN TIME: 13.1 s (ref 11.4–15.2)

## 2017-02-25 SURGERY — LEFT HEART CATH AND CORONARY ANGIOGRAPHY
Anesthesia: LOCAL

## 2017-02-25 MED ORDER — SODIUM CHLORIDE 0.9% FLUSH: 3.0000 mL | Freq: Two times a day (BID) | INTRAVENOUS | Status: DC

## 2017-02-25 MED ORDER — DIAZEPAM 5 MG PO TABS: 5.0000 mg | ORAL_TABLET | Freq: Four times a day (QID) | ORAL | Status: DC | PRN

## 2017-02-25 MED ORDER — SODIUM CHLORIDE 0.9 % WEIGHT BASED INFUSION
3.0000 mL/kg/h | INTRAVENOUS | Status: AC
  Administered 2017-02-25: 3 mL/kg/h via INTRAVENOUS

## 2017-02-25 MED ORDER — HEPARIN (PORCINE) IN NACL 2-0.9 UNIT/ML-% IJ SOLN
INTRAMUSCULAR | Status: AC
  Filled 2017-02-25: qty 1000

## 2017-02-25 MED ORDER — LIDOCAINE HCL 1 % IJ SOLN
INTRAMUSCULAR | Status: AC
  Filled 2017-02-25: qty 20

## 2017-02-25 MED ORDER — ACETAMINOPHEN 325 MG PO TABS: 650.0000 mg | ORAL_TABLET | ORAL | Status: DC | PRN

## 2017-02-25 MED ORDER — SODIUM CHLORIDE 0.9 % IV SOLN: INTRAVENOUS | Status: DC

## 2017-02-25 MED ORDER — HEPARIN SODIUM (PORCINE) 1000 UNIT/ML IJ SOLN
INTRAMUSCULAR | Status: AC
  Filled 2017-02-25: qty 1

## 2017-02-25 MED ORDER — HEPARIN SODIUM (PORCINE) 1000 UNIT/ML IJ SOLN
INTRAMUSCULAR | Status: DC | PRN
  Administered 2017-02-25: 3200 [IU] via INTRAVENOUS

## 2017-02-25 MED ORDER — VERAPAMIL HCL 2.5 MG/ML IV SOLN
INTRAVENOUS | Status: DC | PRN
  Administered 2017-02-25: 10 mL via INTRA_ARTERIAL

## 2017-02-25 MED ORDER — LIDOCAINE HCL (PF) 1 % IJ SOLN
INTRAMUSCULAR | Status: DC | PRN
  Administered 2017-02-25: 2 mL

## 2017-02-25 MED ORDER — MIDAZOLAM HCL 2 MG/2ML IJ SOLN
INTRAMUSCULAR | Status: AC
  Filled 2017-02-25: qty 2

## 2017-02-25 MED ORDER — SODIUM CHLORIDE 0.9 % IV SOLN: 250.0000 mL | INTRAVENOUS | Status: DC | PRN

## 2017-02-25 MED ORDER — FENTANYL CITRATE (PF) 100 MCG/2ML IJ SOLN
INTRAMUSCULAR | Status: AC
  Filled 2017-02-25: qty 2

## 2017-02-25 MED ORDER — SODIUM CHLORIDE 0.9 % WEIGHT BASED INFUSION: 1.0000 mL/kg/h | INTRAVENOUS | Status: DC

## 2017-02-25 MED ORDER — VERAPAMIL HCL 2.5 MG/ML IV SOLN
INTRAVENOUS | Status: AC
  Filled 2017-02-25: qty 2

## 2017-02-25 MED ORDER — IOPAMIDOL (ISOVUE-370) INJECTION 76%
INTRAVENOUS | Status: AC
  Filled 2017-02-25: qty 100

## 2017-02-25 MED ORDER — SODIUM CHLORIDE 0.9% FLUSH: 3.0000 mL | INTRAVENOUS | Status: DC | PRN

## 2017-02-25 MED ORDER — FENTANYL CITRATE (PF) 100 MCG/2ML IJ SOLN
INTRAMUSCULAR | Status: DC | PRN
  Administered 2017-02-25: 25 ug via INTRAVENOUS

## 2017-02-25 MED ORDER — HEPARIN (PORCINE) IN NACL 2-0.9 UNIT/ML-% IJ SOLN
INTRAMUSCULAR | Status: AC | PRN
  Administered 2017-02-25: 1000 mL

## 2017-02-25 MED ORDER — IOPAMIDOL (ISOVUE-370) INJECTION 76%
INTRAVENOUS | Status: DC | PRN
  Administered 2017-02-25: 60 mL via INTRA_ARTERIAL

## 2017-02-25 MED ORDER — MIDAZOLAM HCL 2 MG/2ML IJ SOLN
INTRAMUSCULAR | Status: DC | PRN
  Administered 2017-02-25: 2 mg via INTRAVENOUS

## 2017-02-25 MED ORDER — ASPIRIN 81 MG PO CHEW: 324.0000 mg | CHEWABLE_TABLET | ORAL | Status: DC

## 2017-02-25 MED ORDER — ONDANSETRON HCL 4 MG/2ML IJ SOLN: 4.0000 mg | Freq: Four times a day (QID) | INTRAMUSCULAR | Status: DC | PRN

## 2017-02-25 SURGICAL SUPPLY — 11 items
CATH INFINITI 5FR ANG PIGTAIL (CATHETERS) ×1 IMPLANT
CATH OPTITORQUE TIG 4.0 5F (CATHETERS) ×1 IMPLANT
DEVICE RAD COMP TR BAND LRG (VASCULAR PRODUCTS) ×1 IMPLANT
GLIDESHEATH SLEND SS 6F .021 (SHEATH) ×1 IMPLANT
GUIDEWIRE INQWIRE 1.5J.035X260 (WIRE) IMPLANT
INQWIRE 1.5J .035X260CM (WIRE) ×2
KIT HEART LEFT (KITS) ×2 IMPLANT
PACK CARDIAC CATHETERIZATION (CUSTOM PROCEDURE TRAY) ×2 IMPLANT
SYR MEDRAD MARK V 150ML (SYRINGE) ×2 IMPLANT
TRANSDUCER W/STOPCOCK (MISCELLANEOUS) ×2 IMPLANT
TUBING CIL FLEX 10 FLL-RA (TUBING) ×2 IMPLANT

## 2017-02-25 NOTE — Discharge Instructions (Signed)

## 2017-02-25 NOTE — Interval H&P Note (Signed)
Cath Lab Visit (complete for each Cath Lab visit)  Clinical Evaluation Leading to the Procedure:   ACS: No.  Non-ACS:    Anginal Classification: CCS II  Anti-ischemic medical therapy: No Therapy  Non-Invasive Test Results: Intermediate-risk stress test findings: cardiac mortality 1-3%/year  Prior CABG: No previous CABG      History and Physical Interval Note:  02/25/2017 11:04 AM  Benjamin Jenkins  has presented today for surgery, with the diagnosis of abnormal nuc  The various methods of treatment have been discussed with the patient and family. After consideration of risks, benefits and other options for treatment, the patient has consented to  Procedure(s): LEFT HEART CATH AND CORONARY ANGIOGRAPHY (N/A) as a surgical intervention .  The patient's history has been reviewed, patient examined, no change in status, stable for surgery.  I have reviewed the patient's chart and labs.  Questions were answered to the patient's satisfaction.     Nicki Guadalajarahomas Joel Cowin

## 2017-02-25 NOTE — H&P (View-Only) (Signed)
Cardiology Office Note:    Date:  02/17/2017   ID:  Benjamin Jenkins, DOB 10/24/1955, MRN [id number]  PCP:  Patient, No Pcp Per  Cardiologist:  Rajan R Revankar, MD   Referring MD: No ref. provider found    ASSESSMENT:    1. SOB (shortness of breath)   2. Ex-smoker   3. Abnormal nuclear stress test   4. Coronary artery disease involving native coronary artery of native heart without angina pectoris    PLAN:    In order of problems listed above:  1. In view of the patient's significant symptoms and abnormal stress test I discussed coronary angiography and left heart catheterization with the patient at extensive length. Procedure, benefits and potential risks were explained. Patient had multiple questions which were answered to the patient's satisfaction. Patient agreed and consented for the procedure. Further recommendations will be made based on the findings of the coronary angiography. In the interim. The patient has any significant symptoms he knows to go to the nearest emergency room. 2. Patient has been given a letter to be off work until further notice. I told him not to exert himself physically mentally of sexually daily his coronary angiography was done and he was given clear instructions at that time. He agrees. He will continue to take full-strength coated aspirin. He knows to go to the nearest emergency room for any significant concerns. For his precath blood work we will also assess his lipids and liver check and that will help us initiate him on statins now that we know based on the CT scan reports that he has established coronary artery disease.   Medication Adjustments/Labs and Tests Ordered: Current medicines are reviewed at length with the patient today.  Concerns regarding medicines are outlined above.  No orders of the defined types were placed in this encounter.  No orders of the defined types were placed in this encounter.    Chief Complaint  Patient presents  with  . Follow-up    abnormal Nuclear test.      History of Present Illness:    Benjamin Jenkins is a 61 y.o. male. He was evaluated by me for shortness of breath on exertion. His symptoms were significant. He underwent stress testing. This revealed significant abnormality and the stress test is intermediate risk. CT scan of the chest was unremarkable and negative for pulmonary embolism. Patient continues to complains of shortness of breath on exertion. 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 With activities of daily living he has no problems  History reviewed. No pertinent past medical history.  History reviewed. No pertinent surgical history.  Current Medications: Current Meds  Medication Sig  . ACETAMINOPHEN PO Take 1 tablet by mouth daily as needed.   . albuterol (PROVENTIL HFA;VENTOLIN HFA) 108 (90 Base) MCG/ACT inhaler Inhale 2 puffs into the lungs every 6 (six) hours as needed for wheezing or shortness of breath.  . Fluticasone Propionate (FLONASE NA) Place 1 spray into the nose daily as needed.   . ipratropium (ATROVENT HFA) 17 MCG/ACT inhaler Inhale 2 puffs into the lungs every 6 (six) hours as needed for wheezing.  . Omega-3 Fatty Acids (FISH OIL) 1000 MG CAPS Take 1 capsule by mouth daily.  . TURMERIC PO Take by mouth.     Allergies:   Patient has no known allergies.   Social History   Social History  . Marital status: Single    Spouse name: N/A  . Number of   children: N/A  . Years of education: N/A   Social History Main Topics  . Smoking status: Former Smoker    Packs/day: 0.50    Years: 20.00    Types: Cigarettes  . Smokeless tobacco: Former NeurosurgeonUser    Quit date: 07/21/1988  . Alcohol use 0.6 - 1.8 oz/week    1 - 3 Cans of beer per week     Comment: nightly  . Drug use: No  . Sexual activity: Not Asked   Other Topics Concern  . None   Social History Narrative  . None     Family History: The patient's family history includes Heart disease in  his mother.  ROS:   Please see the history of present illness.    All other systems reviewed and are negative.  EKGs/Labs/Other Studies Reviewed:    The following studies were reviewed today: I reviewed the CT scan of the chest report and stress test report with the patient at extensive length. Questions were answered to his satisfaction.   Recent Labs: 07/21/2016: Hemoglobin 16.5 02/09/2017: BUN 14; Creatinine, Ser 0.89; Potassium 3.8; Sodium 139  Recent Lipid Panel No results found for: CHOL, TRIG, HDL, CHOLHDL, VLDL, LDLCALC, LDLDIRECT  Physical Exam:    VS:  BP 110/76   Pulse 69   Ht 5\' 10"  (1.778 m)   Wt 137 lb 1.9 oz (62.2 kg)   SpO2 98%   BMI 19.67 kg/m     Wt Readings from Last 3 Encounters:  02/17/17 137 lb 1.9 oz (62.2 kg)  02/08/17 138 lb (62.6 kg)  07/24/16 138 lb (62.6 kg)     GEN: Patient is in no acute distress HEENT: Normal NECK: No JVD; No carotid bruits LYMPHATICS: No lymphadenopathy CARDIAC: Hear sounds regular, 2/6 systolic murmur at the apex. RESPIRATORY:  Clear to auscultation without rales, wheezing or rhonchi  ABDOMEN: Soft, non-tender, non-distended MUSCULOSKELETAL:  No edema; No deformity  SKIN: Warm and dry NEUROLOGIC:  Alert and oriented x 3 PSYCHIATRIC:  Normal affect   Signed, Garwin Brothersajan R Revankar, MD  02/17/2017 3:26 PM    [facility]

## 2017-02-26 LAB — COMPREHENSIVE METABOLIC PANEL
ALBUMIN: 4.7 g/dL (ref 3.6–4.8)
ALT: 16 IU/L (ref 0–32)
AST: 23 IU/L (ref 0–40)
Albumin/Globulin Ratio: 1.7 (ref 1.2–2.2)
Alkaline Phosphatase: 92 IU/L (ref 39–117)
BUN / CREAT RATIO: 15 (ref 12–28)
BUN: 16 mg/dL (ref 8–27)
Bilirubin Total: 0.3 mg/dL (ref 0.0–1.2)
CALCIUM: 9.8 mg/dL (ref 8.7–10.3)
CO2: 17 mmol/L — AB (ref 20–29)
CREATININE: 1.08 mg/dL — AB (ref 0.57–1.00)
Chloride: 103 mmol/L (ref 96–106)
GFR, EST AFRICAN AMERICAN: 64 mL/min/{1.73_m2} (ref >59)
GFR, EST NON AFRICAN AMERICAN: 56 mL/min/{1.73_m2} — AB (ref >59)
GLOBULIN, TOTAL: 2.8 g/dL (ref 1.5–4.5)
Glucose: 103 mg/dL — ABNORMAL HIGH (ref 65–99)
Potassium: 5.2 mmol/L (ref 3.5–5.2)
SODIUM: 140 mmol/L (ref 134–144)
TOTAL PROTEIN: 7.5 g/dL (ref 6.0–8.5)

## 2017-02-26 LAB — CBC WITH DIFFERENTIAL/PLATELET
BASOS: 1 %
Basophils Absolute: 0.1 10*3/uL (ref 0.0–0.2)
EOS (ABSOLUTE): 0.2 10*3/uL (ref 0.0–0.4)
Eos: 2 %
Hematocrit: 43.3 % (ref 34.0–46.6)
Hemoglobin: 15 g/dL (ref 11.1–15.9)
IMMATURE GRANS (ABS): 0 10*3/uL (ref 0.0–0.1)
Immature Granulocytes: 0 %
LYMPHS ABS: 1.9 10*3/uL (ref 0.7–3.1)
LYMPHS: 20 %
MCH: 33.9 pg — AB (ref 26.6–33.0)
MCHC: 34.6 g/dL (ref 31.5–35.7)
MCV: 98 fL — AB (ref 79–97)
MONOS ABS: 0.7 10*3/uL (ref 0.1–0.9)
Monocytes: 7 %
NEUTROS ABS: 6.9 10*3/uL (ref 1.4–7.0)
Neutrophils: 70 %
Platelets: 294 10*3/uL (ref 150–379)
RBC: 4.43 x10E6/uL (ref 3.77–5.28)
RDW: 13.6 % (ref 12.3–15.4)
WBC: 9.8 10*3/uL (ref 3.4–10.8)

## 2017-03-01 ENCOUNTER — Encounter: Payer: Self-pay | Admitting: Cardiology

## 2017-03-01 ENCOUNTER — Ambulatory Visit (INDEPENDENT_AMBULATORY_CARE_PROVIDER_SITE_OTHER): Payer: 59 | Admitting: Cardiology

## 2017-03-01 VITALS — BP 104/68 | HR 71 | Ht 70.0 in | Wt 140.4 lb

## 2017-03-01 DIAGNOSIS — Z87891 Personal history of nicotine dependence: Secondary | ICD-10-CM

## 2017-03-01 DIAGNOSIS — I2510 Atherosclerotic heart disease of native coronary artery without angina pectoris: Secondary | ICD-10-CM | POA: Diagnosis not present

## 2017-03-01 DIAGNOSIS — R0602 Shortness of breath: Secondary | ICD-10-CM | POA: Diagnosis not present

## 2017-03-01 MED ORDER — ATORVASTATIN CALCIUM 10 MG PO TABS: 10.0000 mg | ORAL_TABLET | Freq: Every day | ORAL | 11 refills | Status: DC

## 2017-03-01 NOTE — Addendum Note (Signed)
Addended by: Rodney LangtonITTER, JADA M on: 03/01/2017 03:14 PM   Modules accepted: Orders

## 2017-03-01 NOTE — Patient Instructions (Addendum)
Medication Instructions:  Your physician recommends that you continue on your current medications as directed. Please refer to the Current Medication list given to you today.  Labwork: Your physician recommends that you return for lab work in 6 weeks.   Testing/Procedures: None   Follow-Up: Your physician wants you to follow-up in: 6 months. You will receive a reminder letter in the mail two months in advance. If you don't receive a letter, please call our office to schedule the follow-up appointment.  Any Other Special Instructions Will Be Listed Below (If Applicable).  Please note that any paperwork needing to be filled out by the provider will need to be addressed at the front desk prior to seeing the provider. Please note that any paperwork FMLA, Disability or other documents regarding health condition is subject to a $25.00 charge that must be received prior to completion of paperwork.     If you need a refill on your cardiac medications before your next appointment, please call your pharmacy.

## 2017-03-01 NOTE — Progress Notes (Signed)
Cardiology Office Note:    Date:  03/01/2017   ID:  Benjamin Jenkins, DOB 1955-09-17, MRN [id number]  PCP:  Benjamin Jenkins, No Pcp Per  Cardiologist:  Benjamin Brothers, MD   Referring MD: No ref. provider found    ASSESSMENT:    1. Coronary artery disease involving native coronary artery of native heart without angina pectoris   2. SOB (shortness of breath)   3. Ex-smoker    PLAN:    In order of problems listed above:  1. Secondary prevention stressed to him. Importance of compliance with diet and medications stressed. Lipids were reviewed. In view of established coronary artery disease found on CT coronary calcification, I advised atorvastatin 10 mg daily. Benefits and potential this were explained and he verbalized understanding and agreed. He'll be back in 6 weeks for liver lipid check. Further recommendations will be made based at that time. His blood pressure stable. 2. I told him to get a pulmonary evaluation for shortness of breath issues if they persist and he vocalized understanding. He will be given a note to return to work without restrictions.   Medication Adjustments/Labs and Tests Ordered: Current medicines are reviewed at length with the Benjamin Jenkins today.  Concerns regarding medicines are outlined above.  No orders of the defined types were placed in this encounter.  No orders of the defined types were placed in this encounter.    Chief Complaint  Benjamin Jenkins presents with  . Follow-up    Post Cath      History of Present Illness:    Benjamin Jenkins is a 61 y.o. male . Benjamin Jenkins has past medical history of smoking. He underwent CT angiography and this revealed coronary calcification. Coronary angiography was unremarkable. Subsequently is done fine and feels reassured. He underwent CT scanning of the chest for an elevated d-dimer and that was negative for any pulmonary thromboembolism also. He is here for follow-up. He was advised to keep off his work in view of his  significant symptoms and the fact that he does physical work.  History reviewed. No pertinent past medical history.  Past Surgical History:  Procedure Laterality Date  . LEFT HEART CATH AND CORONARY ANGIOGRAPHY N/A 02/25/2017   Procedure: LEFT HEART CATH AND CORONARY ANGIOGRAPHY;  Surgeon: Benjamin Bihari, MD;  Location: [facility];  Service: Cardiovascular;  Laterality: N/A;    Current Medications: Current Meds  Medication Sig  . albuterol (PROVENTIL HFA;VENTOLIN HFA) 108 (90 Base) MCG/ACT inhaler Inhale 2 puffs into the lungs every 6 (six) hours as needed for wheezing or shortness of breath.  Marland Kitchen aspirin 325 MG EC tablet Take 325 mg by mouth daily.  . Cholecalciferol (VITAMIN D-3) 5000 units TABS Take 5,000 Units by mouth daily.  . Coenzyme Q10 (COQ10) 100 MG CAPS Take 100 mg by mouth daily.  . fluticasone (FLONASE) 50 MCG/ACT nasal spray Place 1 spray into both nostrils daily.  Marland Kitchen ipratropium (ATROVENT HFA) 17 MCG/ACT inhaler Inhale 2 puffs into the lungs every 6 (six) hours as needed for wheezing.  Benjamin Jenkins Oil 500 MG CAPS Take 500 mg by mouth daily.  . Multiple Vitamin (MULTIVITAMIN WITH MINERALS) TABS tablet Take 1 tablet by mouth daily.  . nitroGLYCERIN (NITROSTAT) 0.4 MG SL tablet Place 1 tablet (0.4 mg total) under the tongue every 5 (five) minutes as needed for chest pain.  . TURMERIC PO Take 50 mg by mouth daily.     Allergies:   Benjamin Jenkins has no known allergies.  Social History   Social History  . Marital status: Single    Spouse name: N/A  . Number of children: N/A  . Years of education: N/A   Social History Main Topics  . Smoking status: Former Smoker    Packs/day: 0.50    Years: 20.00    Types: Cigarettes  . Smokeless tobacco: Former NeurosurgeonUser    Quit date: 07/21/1988  . Alcohol use 0.6 - 1.8 oz/week    1 - 3 Cans of beer per week     Comment: nightly  . Drug use: No  . Sexual activity: Not Asked   Other Topics Concern  . None   Social History Narrative  .  None     Family History: The Benjamin Jenkins's family history includes Heart disease in his mother.  ROS:   Please see the history of present illness.    All other systems reviewed and are negative.  EKGs/Labs/Other Studies Reviewed:    The following studies were reviewed today: I reviewed lab work, lipids, CT scan and coronary angiography reports and discussed findings with him at extensive length. Had multiple questions which were answered to his satisfaction.   Recent Labs: 02/18/2017: ALT 16; BUN 16; Creatinine, Ser 1.08; Hemoglobin 15.0; Platelets 294; Potassium 5.2; Sodium 140  Recent Lipid Panel    Component Value Date/Time   CHOL 150 02/18/2017 0000   TRIG 52 02/18/2017 0000   HDL 63 02/18/2017 0000   CHOLHDL 2.4 02/18/2017 0000   LDLCALC 77 02/18/2017 0000    Physical Exam:    VS:  BP 104/68   Pulse 71   Ht 5\' 10"  (1.778 m)   Wt 140 lb 6.4 oz (63.7 kg)   SpO2 97%   BMI 20.15 kg/m     Wt Readings from Last 3 Encounters:  03/01/17 140 lb 6.4 oz (63.7 kg)  02/25/17 135 lb (61.2 kg)  02/17/17 137 lb 1.9 oz (62.2 kg)     GEN: Benjamin Jenkins is in no acute distress HEENT: Normal NECK: No JVD; No carotid bruits LYMPHATICS: No lymphadenopathy CARDIAC: Hear sounds regular, 2/6 systolic murmur at the apex. RESPIRATORY:  Clear to auscultation without rales, wheezing or rhonchi  ABDOMEN: Soft, non-tender, non-distended MUSCULOSKELETAL:  No edema; No deformity  SKIN: Warm and dry NEUROLOGIC:  Alert and oriented x 3 PSYCHIATRIC:  Normal affect   Signed, Benjamin Brothersajan R Amontae Ng, MD  03/01/2017 3:04 PM    [facility]

## 2017-03-02 ENCOUNTER — Encounter: Payer: Self-pay | Admitting: Emergency Medicine

## 2017-03-02 ENCOUNTER — Telehealth: Payer: Self-pay | Admitting: Cardiology

## 2017-03-02 DIAGNOSIS — R0602 Shortness of breath: Secondary | ICD-10-CM

## 2017-03-02 DIAGNOSIS — Z87891 Personal history of nicotine dependence: Secondary | ICD-10-CM

## 2017-03-02 NOTE — Telephone Encounter (Signed)
Patient need a note to go back to work and ALSO he was mention that he needed to see the Pulmonologist in our office.. Can we make referral?

## 2017-03-02 NOTE — Telephone Encounter (Signed)
Pt will stop by office to pick up.

## 2017-03-02 NOTE — Telephone Encounter (Signed)
Letter mailed to patients home

## 2017-03-04 ENCOUNTER — Encounter: Payer: Self-pay | Admitting: Pulmonary Disease

## 2017-03-04 ENCOUNTER — Ambulatory Visit (INDEPENDENT_AMBULATORY_CARE_PROVIDER_SITE_OTHER): Payer: 59 | Admitting: Pulmonary Disease

## 2017-03-04 VITALS — BP 145/83 | HR 87 | Ht 70.0 in | Wt 138.0 lb

## 2017-03-04 DIAGNOSIS — Z720 Tobacco use: Secondary | ICD-10-CM | POA: Diagnosis not present

## 2017-03-04 DIAGNOSIS — J449 Chronic obstructive pulmonary disease, unspecified: Secondary | ICD-10-CM

## 2017-03-04 DIAGNOSIS — R0602 Shortness of breath: Secondary | ICD-10-CM

## 2017-03-04 NOTE — Assessment & Plan Note (Signed)
Smoking cessation was emphasized as the most important intervention 

## 2017-03-04 NOTE — Patient Instructions (Signed)
[facility] Based on lung function, we will decide about putting you on long-acting bronchodilator medication  Congratulations on quitting smoking   Pneumonia vaccine recommended Take flu vaccine when available

## 2017-03-04 NOTE — Progress Notes (Signed)
Subjective:    Patient ID: Benjamin BlossomDavid K Hursey, male    DOB: 10/06/1955, 61 y.o.   MRN: [id number]  HPI  Chief Complaint  Patient presents with  . Pulm Consult    for SOB. Has been cleared by cardiology    61 year old smoker referred for evaluation of dyspnea. He smoked about half a pack a day starting as a teenager on and off until he quit about one month ago-about 25 pack years. He was down with the flu in December for 2 weeks and chest x-ray 07/21/16 showed hyperinflation, he was then told that he may have COPD. About 4 weeks ago he developed sudden onset shortness of breath and tachycardia and underwent a cardiac evaluation. CT angiogram 02/09/17 was negative for pulmonary embolism. Nuclear stress test was intermediate risk with EF of 51%, left heart cath did not show any evidence of significant coronary artery disease and he was referred to us.  He denies wheezing or frequent chest colds. He denies a chronic cough. He works in a [employer] and is always lifting heavy boxes and he feels short of breath while working. Albuterol MDI seems to help him for 4-6 hours. He was also given an Atrovent MDI but he is really not used this    Past medical history- chronic headaches and sinusitis  No past medical history on file.   Past Surgical History:  Procedure Laterality Date  . LEFT HEART CATH AND CORONARY ANGIOGRAPHY N/A 02/25/2017   Procedure: LEFT HEART CATH AND CORONARY ANGIOGRAPHY;  Surgeon: Lennette BihariKelly, Thomas A, MD;  Location: [facility];  Service: Cardiovascular;  Laterality: N/A;   Bilateral knee surgery in 1987 and 2005  No Known Allergies   Social History   Social History  . Marital status: Single    Spouse name: N/A  . Number of children: N/A  . Years of education: N/A   Occupational History  . Not on file.   Social History Main Topics  . Smoking status: Former Smoker    Packs/day: 0.50    Years: 20.00    Types: Cigarettes  . Smokeless tobacco: Former NeurosurgeonUser   Quit date: 07/21/1988  . Alcohol use 0.6 - 1.8 oz/week    1 - 3 Cans of beer per week     Comment: nightly  . Drug use: No  . Sexual activity: Not on file   Other Topics Concern  . Not on file   Social History Narrative  . No narrative on file      Family History  Problem Relation Age of Onset  . Heart disease Mother       Review of Systems Positive for shortness of breath with activity, toothaches, headaches, nasal congestion, anxiety, joint stiffness   Constitutional: negative for anorexia, fevers and sweats  Eyes: negative for irritation, redness and visual disturbance  Ears, nose, mouth, throat, and face: negative for earaches, epistaxis, nasal congestion and sore throat  Respiratory: negative for cough, dyspnea on exertion, sputum and wheezing  Cardiovascular: negative for chest pain, dyspnea, lower extremity edema, orthopnea, palpitations and syncope  Gastrointestinal: negative for abdominal pain, constipation, diarrhea, melena, nausea and vomiting  Genitourinary:negative for dysuria, frequency and hematuria  Hematologic/lymphatic: negative for bleeding, easy bruising and lymphadenopathy  Musculoskeletal:negative for arthralgias, muscle weakness  Neurological: negative for coordination problems, gait problems and weakness  Endocrine: negative for diabetic symptoms including polydipsia, polyuria and weight loss       Objective:   Physical Exam  Gen. Pleasant, well-nourished, in  no distress, normal affect ENT - no lesions, no post nasal drip Neck: No JVD, no thyromegaly, no carotid bruits Lungs: no use of accessory muscles, no dullness to percussion, clear without rales or rhonchi  Cardiovascular: Rhythm regular, heart sounds  normal, no murmurs or gallops, no peripheral edema Abdomen: soft and non-tender, no hepatosplenomegaly, BS normal. Musculoskeletal: No deformities, no cyanosis or clubbing Neuro:  alert, non focal       Assessment & Plan:

## 2017-03-04 NOTE — Assessment & Plan Note (Signed)
He likely has COPD Schedule spirometry pre-and post  Based on lung function, we will decide about putting you on long-acting bronchodilator medication   Pneumonia vaccine recommended Take flu vaccine when available

## 2017-03-08 ENCOUNTER — Ambulatory Visit: Payer: 59 | Admitting: Cardiology

## 2017-03-08 ENCOUNTER — Ambulatory Visit (INDEPENDENT_AMBULATORY_CARE_PROVIDER_SITE_OTHER): Payer: 59 | Admitting: Pulmonary Disease

## 2017-03-08 DIAGNOSIS — R0602 Shortness of breath: Secondary | ICD-10-CM

## 2017-03-08 LAB — PULMONARY FUNCTION TEST
FEF 25-75 Post: 2.31 L/sec
FEF 25-75 Pre: 1.98 L/sec
FEF2575-%CHANGE-POST: 16 %
FEF2575-%PRED-POST: 82 %
FEF2575-%Pred-Pre: 70 %
FEV1-%CHANGE-POST: 5 %
FEV1-%PRED-PRE: 87 %
FEV1-%Pred-Post: 92 %
FEV1-PRE: 2.92 L
FEV1-Post: 3.09 L
FEV1FVC-%CHANGE-POST: 4 %
FEV1FVC-%Pred-Pre: 89 %
FEV6-%Change-Post: 0 %
FEV6-%PRED-PRE: 101 %
FEV6-%Pred-Post: 102 %
FEV6-PRE: 4.27 L
FEV6-Post: 4.28 L
FEV6FVC-%Change-Post: 0 %
FEV6FVC-%PRED-PRE: 104 %
FEV6FVC-%Pred-Post: 104 %
FVC-%CHANGE-POST: 1 %
FVC-%PRED-POST: 99 %
FVC-%Pred-Pre: 98 %
FVC-POST: 4.37 L
FVC-Pre: 4.31 L
POST FEV6/FVC RATIO: 100 %
PRE FEV6/FVC RATIO: 99 %
Post FEV1/FVC ratio: 71 %
Pre FEV1/FVC ratio: 68 %

## 2017-03-08 NOTE — Progress Notes (Signed)
PFT done today. 

## 2017-03-26 ENCOUNTER — Other Ambulatory Visit: Payer: Self-pay | Admitting: Physician Assistant

## 2017-03-26 ENCOUNTER — Telehealth: Payer: Self-pay | Admitting: Family Medicine

## 2017-03-26 DIAGNOSIS — R05 Cough: Secondary | ICD-10-CM

## 2017-03-26 DIAGNOSIS — R059 Cough, unspecified: Secondary | ICD-10-CM

## 2017-03-29 NOTE — Telephone Encounter (Signed)
RX was sent in on 03/26/2017

## 2017-04-13 ENCOUNTER — Institutional Professional Consult (permissible substitution): Payer: 59 | Admitting: Emergency Medicine

## 2017-04-25 ENCOUNTER — Other Ambulatory Visit: Payer: Self-pay | Admitting: Physician Assistant

## 2017-04-25 DIAGNOSIS — R059 Cough, unspecified: Secondary | ICD-10-CM

## 2017-04-25 DIAGNOSIS — R05 Cough: Secondary | ICD-10-CM

## 2017-06-03 ENCOUNTER — Ambulatory Visit: Payer: 59 | Admitting: Pulmonary Disease

## 2017-11-03 ENCOUNTER — Other Ambulatory Visit: Payer: Self-pay

## 2017-11-03 ENCOUNTER — Ambulatory Visit (INDEPENDENT_AMBULATORY_CARE_PROVIDER_SITE_OTHER): Payer: 59 | Admitting: Physician Assistant

## 2017-11-03 ENCOUNTER — Encounter (HOSPITAL_COMMUNITY): Payer: Self-pay | Admitting: Emergency Medicine

## 2017-11-03 ENCOUNTER — Encounter: Payer: Self-pay | Admitting: Physician Assistant

## 2017-11-03 ENCOUNTER — Emergency Department (HOSPITAL_COMMUNITY): Payer: 59

## 2017-11-03 ENCOUNTER — Emergency Department (HOSPITAL_COMMUNITY)
Admission: EM | Admit: 2017-11-03 | Discharge: 2017-11-03 | Disposition: A | Discharge status: Home / Self Care | Payer: 59 | Attending: Emergency Medicine | Admitting: Emergency Medicine

## 2017-11-03 VITALS — BP 120/76 | HR 88 | Temp 98.5°F | Resp 16 | Ht 67.56 in | Wt 132.6 lb

## 2017-11-03 DIAGNOSIS — J449 Chronic obstructive pulmonary disease, unspecified: Secondary | ICD-10-CM | POA: Insufficient documentation

## 2017-11-03 DIAGNOSIS — I724 Aneurysm of artery of lower extremity: Secondary | ICD-10-CM

## 2017-11-03 DIAGNOSIS — I2510 Atherosclerotic heart disease of native coronary artery without angina pectoris: Secondary | ICD-10-CM | POA: Diagnosis not present

## 2017-11-03 DIAGNOSIS — R1084 Generalized abdominal pain: Secondary | ICD-10-CM | POA: Diagnosis present

## 2017-11-03 DIAGNOSIS — Z87891 Personal history of nicotine dependence: Secondary | ICD-10-CM

## 2017-11-03 DIAGNOSIS — Z7982 Long term (current) use of aspirin: Secondary | ICD-10-CM | POA: Insufficient documentation

## 2017-11-03 DIAGNOSIS — Z8679 Personal history of other diseases of the circulatory system: Secondary | ICD-10-CM | POA: Diagnosis not present

## 2017-11-03 DIAGNOSIS — I723 Aneurysm of iliac artery: Secondary | ICD-10-CM | POA: Diagnosis not present

## 2017-11-03 DIAGNOSIS — Z79899 Other long term (current) drug therapy: Secondary | ICD-10-CM | POA: Insufficient documentation

## 2017-11-03 DIAGNOSIS — R109 Unspecified abdominal pain: Secondary | ICD-10-CM

## 2017-11-03 HISTORY — DX: Chronic obstructive pulmonary disease, unspecified: J44.9

## 2017-11-03 LAB — COMPREHENSIVE METABOLIC PANEL
ALT: 22 U/L (ref 17–63)
AST: 23 U/L (ref 15–41)
Albumin: 4.1 g/dL (ref 3.5–5.0)
Alkaline Phosphatase: 83 U/L (ref 38–126)
Anion gap: 9 (ref 5–15)
BUN: 7 mg/dL (ref 6–20)
CHLORIDE: 105 mmol/L (ref 101–111)
CO2: 23 mmol/L (ref 22–32)
Calcium: 9.3 mg/dL (ref 8.9–10.3)
Creatinine, Ser: 0.92 mg/dL (ref 0.61–1.24)
Glucose, Bld: 95 mg/dL (ref 65–99)
POTASSIUM: 4.5 mmol/L (ref 3.5–5.1)
SODIUM: 137 mmol/L (ref 135–145)
Total Bilirubin: 0.7 mg/dL (ref 0.3–1.2)
Total Protein: 6.8 g/dL (ref 6.5–8.1)

## 2017-11-03 LAB — CBC
HCT: 42.9 % (ref 39.0–52.0)
Hemoglobin: 15.3 g/dL (ref 13.0–17.0)
MCH: 34.1 pg — ABNORMAL HIGH (ref 26.0–34.0)
MCHC: 35.7 g/dL (ref 30.0–36.0)
MCV: 95.5 fL (ref 78.0–100.0)
PLATELETS: 257 10*3/uL (ref 150–400)
RBC: 4.49 MIL/uL (ref 4.22–5.81)
RDW: 12.5 % (ref 11.5–15.5)
WBC: 9.8 10*3/uL (ref 4.0–10.5)

## 2017-11-03 LAB — URINALYSIS, ROUTINE W REFLEX MICROSCOPIC
BILIRUBIN URINE: NEGATIVE
Bacteria, UA: NONE SEEN
GLUCOSE, UA: NEGATIVE mg/dL
KETONES UR: NEGATIVE mg/dL
LEUKOCYTES UA: NEGATIVE
Nitrite: NEGATIVE
PROTEIN: NEGATIVE mg/dL
Specific Gravity, Urine: 1.011 (ref 1.005–1.030)
Squamous Epithelial / LPF: NONE SEEN
pH: 6 (ref 5.0–8.0)

## 2017-11-03 LAB — I-STAT CG4 LACTIC ACID, ED: LACTIC ACID, VENOUS: 0.97 mmol/L (ref 0.5–1.9)

## 2017-11-03 LAB — LIPASE, BLOOD: LIPASE: 62 U/L — AB (ref 11–51)

## 2017-11-03 MED ORDER — IOPAMIDOL (ISOVUE-370) INJECTION 76%
INTRAVENOUS | Status: AC
  Filled 2017-11-03: qty 100

## 2017-11-03 MED ORDER — IOPAMIDOL (ISOVUE-370) INJECTION 76%
100.0000 mL | Freq: Once | INTRAVENOUS | Status: AC | PRN
  Administered 2017-11-03: 100 mL via INTRAVENOUS

## 2017-11-03 NOTE — ED Notes (Signed)
ED Provider at bedside to update pt on results and plan of care

## 2017-11-03 NOTE — Patient Instructions (Addendum)
Please go to the emergency room for further evaluation of your abdominal pain.  We worried that  you may have an abdominal aneurysm and the ripping sensation could mean you need vascular surgery very soon.    IF you received an x-ray today, you will receive an invoice from [facility]. Please contact [facility] at [phone] with questions or concerns regarding your invoice.   IF you received labwork today, you will receive an invoice from [facility]. Please contact [facility] at [phone] with questions or concerns regarding your invoice.   Our billing staff will not be able to assist you with questions regarding bills from these companies.  You will be contacted with the lab results as soon as they are available. The fastest way to get your results is to activate your My Chart account. Instructions are located on the last page of this paperwork. If you have not heard from us regarding the results in 2 weeks, please contact this office.

## 2017-11-03 NOTE — Discharge Instructions (Signed)
Follow up with Dr Myra GianottiBrabham, vascular surgery.  Call to schedule an appointment

## 2017-11-03 NOTE — ED Provider Notes (Signed)
[facility] EMERGENCY DEPARTMENT Provider Note   CSN: [id number] Arrival date & time: 11/03/17  1338     History   Chief Complaint Chief Complaint  Patient presents with  . Abdominal Pain    HPI Benjamin Jenkins is a 62 y.o. male.  HPI Patient presents to the emergency room for evaluation of abdominal pain.  Patient states he had some sharp tearing abdominal pain with some burning discomfort about a week ago.  Over the last week he has had some intermittent abdominal pressure.  He does not continue to have the burning tearing pain.  He does have occasionally some discomfort after he is eating and when he bends over.  Patient denies any trouble with vomiting or diarrhea.  No fevers or chills.  No chest pain or shortness of breath.  He was seen in urgent care today and they were worried about the possibility of an abdominal aortic aneurysm.  They sent him to the ED for further imaging Past Medical History:  Diagnosis Date  . COPD (chronic obstructive pulmonary disease) (HCC)   . Coronary artery disease     Patient Active Problem List   Diagnosis Date Noted  . CAD (coronary artery disease) 02/17/2017  . COPD with chronic bronchitis and emphysema (HCC) 02/08/2017  . Tobacco abuse 02/08/2017    Past Surgical History:  Procedure Laterality Date  . LEFT HEART CATH AND CORONARY ANGIOGRAPHY N/A 02/25/2017   Procedure: LEFT HEART CATH AND CORONARY ANGIOGRAPHY;  Surgeon: Lennette Bihari, MD;  Location: [facility] INVASIVE CV LAB;  Service: Cardiovascular;  Laterality: N/A;        Home Medications    Prior to Admission medications   Medication Sig Start Date End Date Taking? Authorizing Provider  aspirin 325 MG EC tablet Take 325 mg by mouth daily.    [provider]  atorvastatin (LIPITOR) 10 MG tablet Take 1 tablet (10 mg total) by mouth daily. 03/01/17   Revankar, Aundra Dubin, MD  ATROVENT HFA 17 MCG/ACT inhaler TAKE 2 PUFFS BY MOUTH EVERY 6 HOURS AS NEEDED FOR  WHEEZE 04/26/17   Benjiman Core D, PA-C  Cholecalciferol (VITAMIN D-3) 5000 units TABS Take 5,000 Units by mouth daily.    [provider]  Coenzyme Q10 (COQ10) 100 MG CAPS Take 100 mg by mouth daily.    [provider]  fluticasone (FLONASE) 50 MCG/ACT nasal spray Place 1 spray into both nostrils daily.    [provider]  [name] 500 MG CAPS Take 500 mg by mouth daily.    [provider]  Multiple Vitamin (MULTIVITAMIN WITH MINERALS) TABS tablet Take 1 tablet by mouth daily.    [provider]  nitroGLYCERIN (NITROSTAT) 0.4 MG SL tablet Place 1 tablet (0.4 mg total) under the tongue every 5 (five) minutes as needed for chest pain. 02/17/17 05/18/17  Revankar, Aundra Dubin, MD  TURMERIC PO Take 50 mg by mouth daily.    [provider]    Family History Family History  Problem Relation Age of Onset  . Heart disease Mother     Social History Social History   Tobacco Use  . Smoking status: Former Smoker    Packs/day: 0.50    Years: 20.00    Pack years: 10.00    Types: Cigarettes  . Smokeless tobacco: Former Neurosurgeon    Quit date: 07/21/1988  Substance Use Topics  . Alcohol use: Yes    Alcohol/week: 0.6 - 1.8 oz    Types:  1 - 3 Cans of beer per week    Comment: nightly  . Drug use: No     Allergies   Patient has no known allergies.   Review of Systems Review of Systems  All other systems reviewed and are negative.    Physical Exam Updated Vital Signs BP (!) 143/102 (BP Location: Right Arm)   Pulse 66   Temp 99.2 F (37.3 C) (Oral)   Resp 14   SpO2 99%   Physical Exam  Constitutional: He appears well-developed and well-nourished. No distress.  HENT:  Head: Normocephalic and atraumatic.  Right Ear: External ear normal.  Left Ear: External ear normal.  Eyes: Conjunctivae are normal. Right eye exhibits no discharge. Left eye exhibits no discharge. No scleral icterus.  Neck: Neck supple. No tracheal deviation present.   Cardiovascular: Normal rate, regular rhythm and intact distal pulses.  Pulmonary/Chest: Effort normal and breath sounds normal. No stridor. No respiratory distress. He has no wheezes. He has no rales.  Abdominal: Soft. Bowel sounds are normal. He exhibits no distension. There is no tenderness. There is no rebound and no guarding.  Musculoskeletal: He exhibits no edema or tenderness.  Neurological: He is alert. He has normal strength. No cranial nerve deficit (no facial droop, extraocular movements intact, no slurred speech) or sensory deficit. He exhibits normal muscle tone. He displays no seizure activity. Coordination normal.  Skin: Skin is warm and dry. No rash noted.  Psychiatric: He has a normal mood and affect.  Nursing note and vitals reviewed.    ED Treatments / Results  Labs (all labs ordered are listed, but only abnormal results are displayed) Labs Reviewed  CBC - Abnormal; Notable for the following components:      Result Value   MCH 34.1 (*)    All other components within normal limits  LIPASE, BLOOD - Abnormal; Notable for the following components:   Lipase 62 (*)    All other components within normal limits  URINALYSIS, ROUTINE W REFLEX MICROSCOPIC - Abnormal; Notable for the following components:   Hgb urine dipstick MODERATE (*)    All other components within normal limits  COMPREHENSIVE METABOLIC PANEL  I-STAT CG4 LACTIC ACID, ED    EKG None  Radiology Ct Angio Abd/pel W And/or Wo Contrast  Result Date: 11/03/2017 CLINICAL DATA:  Pulsatile abdominal mass. Evaluate for abdominal aortic aneurysm. EXAM: CTA ABDOMEN AND PELVIS WITH CONTRAST TECHNIQUE: Multidetector CT imaging of the abdomen and pelvis was performed using the standard protocol during bolus administration of intravenous contrast. Multiplanar reconstructed images and MIPs were obtained and reviewed to evaluate the vascular anatomy. CONTRAST:  ISOVUE-370 IOPAMIDOL (ISOVUE-370) INJECTION 76%  COMPARISON:  None. FINDINGS: VASCULAR Aorta: There is a minimal amount of calcified atherosclerotic plaque within a tortuous but normal caliber abdominal aorta. The abdominal aorta measures approximately 2.3 x 2.2 cm in maximal diameter (axial image 102, series 5, coronal image 37, series 8). No abdominal aortic dissection or periaortic stranding. Celiac: There is tortuosity involving the origin of the celiac artery, not resulting in hemodynamically significant stenosis. The distal aspect of the main trunk of the celiac artery appears mildly ectatic measuring approximately 0.9 cm in diameter (image 57, series 5). Conventional branching pattern. There is mild beaded irregularity involving the common hepatic artery (image 57, series 5). The splenic artery is tortuous though widely patent and of normal caliber. SMA: The SMA is mildly though diffusely ectatic within main trunk measuring approximately 1 cm in diameter (image 63, series  5). Conventional branching pattern. The distal tributaries the SMA appear widely patent without discrete intraluminal filling defect to suggest distal embolism. 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 The bilateral renal arteries appear widely patent without hemodynamically significant narrowing. No vessel irregularity to suggest FMD. IMA: Remains widely patent. _________________________________________________________ Right-sided pelvic inflow and proximal outflow: The right common iliac artery is mildly ectatic measuring 1.5 cm (image 126, series 5). 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 The right external iliac artery is mildly ectatic measuring 1.2 cm. There is aneurysmal dilatation of the imaged proximal aspect of the right superficial femoral  artery measuring approximately 2.5 cm in diameter (image 204, series 5). There is a moderate amount of crescentic mural thrombus within the dominant component of the aneurysm. _________________________________________________________ Left-sided pelvic inflow and proximal outflow: The left common iliac artery is mildly ectatic measuring 1.8 cm in diameter. Note is made of a partially thrombosed left internal iliac artery aneurysm measuring approximately 2 cm in diameter (coronal image 56, series 8). The left external iliac artery is mildly ectatic measuring 1.3 cm in diameter. Aneurysmal dilatation of the imaged proximal aspects of the left superficial femoral artery measuring approximately 3.4 cm in diameter (image 194, series 5). There is a large amount of crescentic mural thrombus within the dominant component of the left femoral aneurysm. Veins: Pelvic venous system and IVC appear widely patent on this arterial phase examination. Review of the MIP images confirms the above findings. NON-VASCULAR Evaluation of the abdominal organs is limited to the arterial phase of enhancement. Lower chest: Limited visualization of the lower thorax demonstrates centrilobular emphysematous change within the imaged lung bases. Minimal dependent subpleural ground-glass atelectasis. No pleural effusion. Normal heart size.  No pericardial effusion. Hepatobiliary: Normal hepatic contour. No discrete hyperenhancing hepatic lesions. Normal appearance of the gallbladder given degree distention. No radiopaque gallstones. No definite intra extrahepatic biliary duct dilatation. No ascites. Pancreas: Normal early arterial phase appearance of the pancreas Spleen: Normal early arterial phase appearance of the spleen. Note is made of a small splenule. Adrenals/Urinary Tract: There is symmetric enhancement of the bilateral kidneys. No definite renal stones on this postcontrast examination. No discrete renal lesions. No urine obstruction or  perinephric stranding. Normal appearance the bilateral adrenal glands. No appearance of the urinary bladder given degree distention. Stomach/Bowel: Moderate colonic stool burden without evidence of enteric obstruction. Scattered minimal colonic diverticulosis without evidence of diverticulitis. Normal appearance of the terminal ileum. The appendix is not visualized, however there is no pericecal inflammatory change. No pneumoperitoneum, pneumatosis or portal venous gas. Lymphatic: No bulky retroperitoneal, mesenteric, pelvic or inguinal lymphadenopathy. Reproductive: Normal appearance of the prostate gland. No free fluid in the pelvic cul-de-sac. Other: Regional soft tissues appear normal. Musculoskeletal: No acute or aggressive osseous abnormalities. Mild-to-moderate multilevel lumbar spine DDD, worse at L4-L5 and L5-S1 with disc space height loss, endplate irregularity and sclerosis. IMPRESSION: VASCULAR 1. Minimal amount of atherosclerotic plaque within a tortuous but normal caliber abdominal aorta. No abdominal aortic dissection or periaortic stranding. 2. 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 All aneurysms contain a moderate amount of crescentic  mural thrombus, however there is no evidence perivascular stranding or contrast extravasation. 3. Rather diffuse arteriomegaly with mild ectasia the SMA, bilateral common and external iliac arteries. 4. Nonspecific beaded irregularity involving the common hepatic artery potentially indicative of FMD. NON-VASCULAR 1. No acute findings within the abdomen or pelvis. Specifically, the major mesenteric branch vessels of the abdominal aorta appear patent without evidence of a hemodynamically significant stenosis or distal embolism. Critical Value/emergent results were  called by telephone at the time of interpretation on 11/03/2017 at 8:28 pm to Dr. Linwood DibblesJON Tanyia Grabbe , who verbally acknowledged these results. Electronically Signed   By: Simonne ComeJohn  Watts M.D.   On: 11/03/2017 20:28    Procedures Procedures (including critical care time)  Medications Ordered in ED Medications  iopamidol (ISOVUE-370) 76 % injection (has no administration in time range)  iopamidol (ISOVUE-370) 76 % injection 100 mL (100 mLs Intravenous Contrast Given 11/03/17 1936)     Initial Impression / Assessment and Plan / ED Course  I have reviewed the triage vital signs and the nursing notes.  Pertinent labs & imaging results that were available during my care of the patient were reviewed by me and considered in my medical decision making (see chart for details).     Pt  presented to the emergency room for evaluation of abdominal pain.  Patient CT scan did not show any evidence of abdominal aortic aneurysm.  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  I discussed the case with Dr. Myra GianottiBrabham who recommends outpatient follow-up with him.  The patient will need some additional imaging but there is no need for any emergent treatment tonight.  I discussed the findings with the patient.  Also recommend he try to quit smoking.  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

## 2017-11-03 NOTE — ED Triage Notes (Signed)
Pt arrives via POV from urgent care. Sent here for further eval of ripping/tearing sensation in abdomen one week ago. Pt reports pain more like pressure after eating. VSS. Pt awake, alert, ambulatory. NAD at present.

## 2017-11-03 NOTE — Progress Notes (Signed)
11/03/2017 1:06 PM   DOB: [birth year] / MRN: [id number]  SUBJECTIVE:  Benjamin Jenkins is a 62 y.o. male with a history of smoking presenting for "ripping" abdominal pain.  Patient is a known vasculopath with a history of smoking.  Tells me the "ripping" sensation in his abdomen gets worse anytime he performs any type of physical activity.  Most recent heart cath was negative however recent stress test does show signs of ischemia.  He quit smoking 29 years ago.  He denies any shortness of breath, DOE, presyncope at this time.  He denies a history of hypertension.  He has No Known Allergies.   He  has no past medical history on file.     He  reports that he has quit smoking. His smoking use included cigarettes. He has a 10.00 pack-year smoking history. He quit smokeless tobacco use about 29 years ago. He reports that he drinks about 0.6 - 1.8 oz of alcohol per week. He reports that he does not use drugs. He  has no sexual activity history on file. The patient  has a past surgical history that includes LEFT HEART CATH AND CORONARY ANGIOGRAPHY (N/A, 02/25/2017).  His family history includes Heart disease in his mother.  Review of Systems  Respiratory: Negative for sputum production.   Cardiovascular: Negative for chest pain.  Gastrointestinal: Positive for abdominal pain. Negative for blood in stool, constipation, diarrhea, heartburn, melena, nausea and vomiting.  Genitourinary: Negative for dysuria, flank pain, frequency, hematuria and urgency.  Neurological: Negative for dizziness.    The problem list and medications were reviewed and updated by myself where necessary and exist elsewhere in the encounter.   OBJECTIVE:  BP 120/76   Pulse 88   Temp 98.5 F (36.9 C)   Resp 16   Ht 5' 7.56" (1.716 m)   Wt 132 lb 9.6 oz (60.1 kg)   SpO2 98%   BMI 20.43 kg/m   Physical Exam  Constitutional: He is oriented to person, place, and time. He appears well-developed. He does not appear ill.    Eyes: Pupils are equal, round, and reactive to light. Conjunctivae and EOM are normal.  Cardiovascular: Normal rate.  Pulmonary/Chest: Effort normal.  Abdominal: He exhibits no distension.    Musculoskeletal: Normal range of motion.  Neurological: He is alert and oriented to person, place, and time. No cranial nerve deficit. Coordination normal.  Skin: Skin is warm and dry. He is not diaphoretic.  Psychiatric: He has a normal mood and affect.  Nursing note and vitals reviewed.   No results found for this or any previous visit (from the past 72 hour(s)).  No results found.  ASSESSMENT AND PLAN:  [name] was seen today for lump in groin.  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  He was evaluated by his occupational health doctor where a sliding hernia about the umbilicus was found and the patient was told that he had inguinal lymphadenopathy.  Patient has impressive femoral artery pulses bilaterally.  His abdominal pain though mild continues today.  Dr. Alvy BimlerSagardia did examine this patient and felt that his femoral artery exam was abnormal patient and noted p 2 weeks of atient with some mild pulsatile abdomen about the aorta as well.  I have spoken with Dr. Jacinto HalimGanji with regard to stat evaluation versus outpatient and he is  concerned given the patient's presentation and history of smoking at the patient may be having an aneurysmal symptoms.  We feels in the best interest of the patient to be seen in the ED for stat rule out of abdominal aneurysm and rupture.  Plan discussed with patient and he is comfortable with driving himself to the ED for further evaluation.  History of smoking 10-25 pack years  History of arteriosclerotic vascular disease    The patient is advised to call or return to clinic if he does not see an improvement in  symptoms, or to seek the care of the closest emergency department if he worsens with the above plan.   Deliah Boston, MHS, PA-C Primary Care at [facility] 11/03/2017 1:06 PM

## 2017-11-03 NOTE — ED Provider Notes (Signed)
Patient placed in Quick Look pathway, seen and evaluated   Chief Complaint: Abdominal pain   HPI:   62 year old male presenting from UC with a chief complaint of abdominal pain.  The patient reports that he was at work 1 week ago lifting heavy boxes when he suddenly felt a ripping, burning abdominal pain.  States that they ripping, burning pain has resolved, but he has endorsed intermittent abdominal pressure throughout the abdomen over the last week.  Pressure is worse after eating and when he bends over.  Denies fever, chills, constipation, or emesis.  He is a former smoker, quit 29 years ago.  No history of similar symptoms.  He was sent by UC due to concern for AAA and abdominal aortic dissection.   ROS: Abdominal pain   Physical Exam:   Gen: No distress  Neuro: Awake and Alert  Skin: Warm    Focused Exam: Abdomen is soft. Minimal distension.  No tenderness to palpation in all 4 quadrants.  Hyperactive bowel sounds in all 4 quadrants.  Bilateral femoral and radial pulses are 3+ and symmetric.    Initiation of care has begun. The patient has been counseled on the process, plan, and necessity for staying for the completion/evaluation, and the remainder of the medical screening examination    Barkley BoardsMcDonald, Rebekha Diveley A, PA-C 11/03/17 1546    Nira Connardama, Pedro Eduardo, MD 11/04/17 1127

## 2017-11-03 NOTE — ED Notes (Signed)
ED Provider at bedside. 

## 2017-11-03 NOTE — Progress Notes (Signed)
Patient came to ER with abdominal pain.  He had CT imaging which I was asked to review.  Nothing vascular on his CTA could explain his abdominal pain which has now resolved.  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  I will arrange follow up with us.  WElls Grettell Ransdell

## 2017-11-08 ENCOUNTER — Telehealth: Payer: Self-pay | Admitting: Surgery

## 2017-11-08 NOTE — Telephone Encounter (Signed)
Sched appt 12/03/17 at 3:00. Lm on cell# to inform pt of appt.

## 2017-11-08 NOTE — Telephone Encounter (Signed)
-----   Message from Sharee PimpleMarilyn K McChesney, RN sent at 11/04/2017 10:06 AM EDT ----- Regarding: Any available MD, new patient   ----- Message ----- From: Nada LibmanBrabham, Vance W, MD Sent: 11/03/2017   9:08 PM To: [facility] Charge Pool  I reviewed a CTA from a ER patient that had incidental finding of bilateral femoral aneurysms.  He will need to be evaluated as a new patient by any available MD within a few weeks.  Please contact him with the appointment.  Thanks

## 2017-12-03 ENCOUNTER — Ambulatory Visit (INDEPENDENT_AMBULATORY_CARE_PROVIDER_SITE_OTHER): Payer: 59 | Admitting: Surgery

## 2017-12-03 ENCOUNTER — Other Ambulatory Visit: Payer: Self-pay

## 2017-12-03 ENCOUNTER — Encounter: Payer: Self-pay | Admitting: Surgery

## 2017-12-03 VITALS — BP 134/86 | HR 94 | Temp 98.8°F | Resp 18 | Ht 69.0 in | Wt 137.0 lb

## 2017-12-03 DIAGNOSIS — I724 Aneurysm of artery of lower extremity: Secondary | ICD-10-CM

## 2017-12-03 NOTE — Progress Notes (Signed)
Vascular and Vein Specialist of [facility]  Patient name: Benjamin Jenkins MRN: [id number] DOB: Feb 01, 1956 Sex: male   REQUESTING PROVIDER:    ER   REASON FOR CONSULT:    Femoral aneurysms  HISTORY OF PRESENT ILLNESS:   Benjamin Jenkins is a 62 y.o. male, who presented to the emergency department on 11/03/2017 for evaluation of abdominal pain which he described as sharp and tearing he underwent a CT scan to evaluate for arterial pathology.  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  His reason for abdominal pain was felt to be a umbilical hernia which he is being evaluated for but is waiting on Workmen's Comp.  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  He is medically managed for hypercholesterolemia with a statin.  He is followed by Dr. Jacinto Halim for his coronary artery disease.  PAST MEDICAL HISTORY    Past Medical History:  Diagnosis Date  . COPD (chronic obstructive pulmonary disease) (HCC)   . Coronary artery disease      FAMILY HISTORY   Family History  Problem Relation Age of Onset  . Heart disease Mother     SOCIAL HISTORY:   Social History   Socioeconomic History  . Marital status: Single    Spouse name: Not on file  . Number of children: Not on file  . Years of education: Not on file  . Highest education level: Not on file  Occupational History  . Not on file  Social Needs  . Financial resource strain: Not on file  . Food insecurity:    Worry: Not on file    Inability: Not on file  . Transportation needs:    Medical: Not on file    Non-medical: Not on file  Tobacco Use  . Smoking status: Former Smoker    Packs/day: 0.50    Years: 20.00    Pack years: 10.00    Types: Cigarettes  . Smokeless tobacco: Former Neurosurgeon    Quit date: 07/21/1988  Substance and Sexual Activity  . Alcohol use: Yes    Alcohol/week: 0.6 - 1.8 oz    Types: 1 - 3 Cans of beer per week    Comment: nightly  . Drug  use: No  . Sexual activity: Not on file  Lifestyle  . Physical activity:    Days per week: Not on file    Minutes per session: Not on file  . Stress: Not on file  Relationships  . Social connections:    Talks on phone: Not on file    Gets together: Not on file    Attends religious service: Not on file    Active member of club or organization: Not on file    Attends meetings of clubs or organizations: Not on file    Relationship status: Not on file  . Intimate partner violence:    Fear of current or ex partner: Not on file    Emotionally abused: Not on file    Physically abused: Not on file    Forced sexual activity: Not on file  Other Topics Concern  . Not on file  Social History Narrative  . Not on file    ALLERGIES:    No Known Allergies  CURRENT MEDICATIONS:    Current Outpatient Medications  Medication Sig Dispense Refill  . atorvastatin (LIPITOR) 10 MG tablet Take 1 tablet (10 mg total) by mouth daily. 30 tablet 11  . ATROVENT HFA 17 MCG/ACT inhaler TAKE 2 PUFFS  BY MOUTH EVERY 6 HOURS AS NEEDED FOR WHEEZE (Patient taking differently: Inhale 2 puffs into the lungs in the morning) 12.9 Inhaler 0  . Cholecalciferol (VITAMIN D-3) 5000 units TABS Take 5,000 Units by mouth daily.    . Coenzyme Q10 (COQ10) 100 MG CAPS Take 100 mg by mouth daily.    [name] 500 MG CAPS Take 500 mg by mouth daily.    . Multiple Vitamin (MULTIVITAMIN WITH MINERALS) TABS tablet Take 1 tablet by mouth daily.    . nitroGLYCERIN (NITROSTAT) 0.4 MG SL tablet Place 1 tablet (0.4 mg total) under the tongue every 5 (five) minutes as needed for chest pain. 25 tablet 11  . TURMERIC PO Take 1 capsule by mouth daily.      No current facility-administered medications for this visit.     REVIEW OF SYSTEMS:    denotes positive finding,  denotes negative finding Cardiac  Comments:  Chest pain or chest pressure:    Shortness of breath upon exertion: x   Short of breath when lying flat:      Irregular heart rhythm:        Vascular    Pain in calf, thigh, or hip brought on by ambulation: x   Pain in feet at night that wakes you up from your sleep:     Blood clot in your veins:    Leg swelling:         Pulmonary    Oxygen at home:    Productive cough:     Wheezing:         Neurologic    Sudden weakness in arms or legs:     Sudden numbness in arms or legs:     Sudden onset of difficulty speaking or slurred speech:    Temporary loss of vision in one eye:     Problems with dizziness:         Gastrointestinal    Blood in stool:      Vomited blood:         Genitourinary    Burning when urinating:     Blood in urine:        Psychiatric    Major depression:         Hematologic    Bleeding problems:    Problems with blood clotting too easily:        Skin    Rashes or ulcers:        Constitutional    Fever or chills:     PHYSICAL EXAM:   There were no vitals filed for this visit.  GENERAL: The patient is a well-nourished male, in no acute distress. The vital signs are documented above. CARDIAC: There is a regular rate and rhythm.  VASCULAR: Prominent femoral pulse bilaterally.  Palpable pedal pulses. PULMONARY: Nonlabored respirations ABDOMEN: Soft and non-tender with normal pitched bowel sounds.  MUSCULOSKELETAL: There are no major deformities or cyanosis. NEUROLOGIC: No focal weakness or paresthesias are detected. SKIN: There are no ulcers or rashes noted. PSYCHIATRIC: The patient has a normal affect.  STUDIES:   I have reviewed his CT angiogram of the following findings:  1. Minimal amount of atherosclerotic plaque within a tortuous but normal caliber abdominal aorta. No abdominal aortic dissection or periaortic stranding. 2. 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 All aneurysms contain a moderate amount of crescentic mural thrombus, however there is no evidence perivascular stranding or contrast extravasation. 3. Rather diffuse arteriomegaly with mild ectasia the SMA, bilateral common and external iliac arteries. 4. Nonspecific beaded irregularity involving the common hepatic artery potentially indicative of FMD.    ASSESSMENT and PLAN   Bilateral femoral aneurysms: I discussed with the patient that based on the size I would recommend repair.  We discussed the details of the procedure as well as the recovery time.  We will try to arrange this in the same time.  As his umbilical hernia repair.  I told him that I would not do the procedures at the same time but we could do them close together so that his recovery.  Overlap so that he can get back to work as soon as possible.  I am also going to have him cleared from cardiology standpoint.  Patient is going to contact me within the next 4 to 6 weeks to get this scheduled.   Durene Cal, MD Vascular and Vein Specialists of [facility] [phone] Pager [phone]

## 2017-12-20 ENCOUNTER — Other Ambulatory Visit: Payer: Self-pay | Admitting: *Deleted

## 2017-12-20 NOTE — Progress Notes (Signed)
Phone call to patient instructed to be at [facility] admitting office at 5:30 am on 01/06/18 for surgery. NPO pas MN night prior and to expect a call and follow the detailed instructions received from the pre-admission  department about this surgery.

## 2017-12-31 ENCOUNTER — Encounter (HOSPITAL_COMMUNITY): Payer: Self-pay

## 2017-12-31 ENCOUNTER — Other Ambulatory Visit: Payer: Self-pay

## 2017-12-31 ENCOUNTER — Encounter (HOSPITAL_COMMUNITY)
Admission: RE | Admit: 2017-12-31 | Discharge: 2017-12-31 | Disposition: A | Discharge status: Home / Self Care | Payer: 59 | Source: Ambulatory Visit | Attending: Surgery | Admitting: Surgery

## 2017-12-31 DIAGNOSIS — Z7982 Long term (current) use of aspirin: Secondary | ICD-10-CM | POA: Insufficient documentation

## 2017-12-31 DIAGNOSIS — F419 Anxiety disorder, unspecified: Secondary | ICD-10-CM | POA: Diagnosis not present

## 2017-12-31 DIAGNOSIS — I724 Aneurysm of artery of lower extremity: Secondary | ICD-10-CM | POA: Diagnosis not present

## 2017-12-31 DIAGNOSIS — Z9889 Other specified postprocedural states: Secondary | ICD-10-CM | POA: Insufficient documentation

## 2017-12-31 DIAGNOSIS — I739 Peripheral vascular disease, unspecified: Secondary | ICD-10-CM | POA: Diagnosis not present

## 2017-12-31 DIAGNOSIS — Z01812 Encounter for preprocedural laboratory examination: Secondary | ICD-10-CM | POA: Diagnosis present

## 2017-12-31 DIAGNOSIS — R0600 Dyspnea, unspecified: Secondary | ICD-10-CM | POA: Insufficient documentation

## 2017-12-31 DIAGNOSIS — Z79899 Other long term (current) drug therapy: Secondary | ICD-10-CM | POA: Insufficient documentation

## 2017-12-31 DIAGNOSIS — J449 Chronic obstructive pulmonary disease, unspecified: Secondary | ICD-10-CM | POA: Insufficient documentation

## 2017-12-31 HISTORY — DX: Anxiety disorder, unspecified: F41.9

## 2017-12-31 HISTORY — DX: Unspecified osteoarthritis, unspecified site: M19.90

## 2017-12-31 HISTORY — DX: Peripheral vascular disease, unspecified: I73.9

## 2017-12-31 HISTORY — DX: Headache, unspecified: R51.9

## 2017-12-31 HISTORY — DX: Dizziness and giddiness: R42

## 2017-12-31 HISTORY — DX: Headache: R51

## 2017-12-31 HISTORY — DX: Dyspnea, unspecified: R06.00

## 2017-12-31 LAB — URINALYSIS, ROUTINE W REFLEX MICROSCOPIC
Bacteria, UA: NONE SEEN
Bilirubin Urine: NEGATIVE
Glucose, UA: NEGATIVE mg/dL
Ketones, ur: NEGATIVE mg/dL
Leukocytes, UA: NEGATIVE
Nitrite: NEGATIVE
Protein, ur: NEGATIVE mg/dL
Specific Gravity, Urine: 1.003 — ABNORMAL LOW (ref 1.005–1.030)
pH: 6 (ref 5.0–8.0)

## 2017-12-31 LAB — CBC
HCT: 44.5 % (ref 39.0–52.0)
HEMOGLOBIN: 15.1 g/dL (ref 13.0–17.0)
MCH: 32.6 pg (ref 26.0–34.0)
MCHC: 33.9 g/dL (ref 30.0–36.0)
MCV: 96.1 fL (ref 78.0–100.0)
Platelets: 278 10*3/uL (ref 150–400)
RBC: 4.63 MIL/uL (ref 4.22–5.81)
RDW: 12.5 % (ref 11.5–15.5)
WBC: 8.2 10*3/uL (ref 4.0–10.5)

## 2017-12-31 LAB — ABO/RH: ABO/RH(D): O POS

## 2017-12-31 LAB — COMPREHENSIVE METABOLIC PANEL
ALK PHOS: 88 U/L (ref 38–126)
ALT: 22 U/L (ref 17–63)
ANION GAP: 7 (ref 5–15)
AST: 24 U/L (ref 15–41)
Albumin: 4.2 g/dL (ref 3.5–5.0)
BUN: 11 mg/dL (ref 6–20)
CALCIUM: 9.3 mg/dL (ref 8.9–10.3)
CO2: 26 mmol/L (ref 22–32)
Chloride: 107 mmol/L (ref 101–111)
Creatinine, Ser: 0.9 mg/dL (ref 0.61–1.24)
GFR calc Af Amer: >60 mL/min (ref >60)
GFR calc non Af Amer: >60 mL/min (ref >60)
GLUCOSE: 98 mg/dL (ref 65–99)
POTASSIUM: 4.5 mmol/L (ref 3.5–5.1)
SODIUM: 140 mmol/L (ref 135–145)
Total Bilirubin: 0.5 mg/dL (ref 0.3–1.2)
Total Protein: 7 g/dL (ref 6.5–8.1)

## 2017-12-31 LAB — APTT: aPTT: 31 seconds (ref 24–36)

## 2017-12-31 LAB — PROTIME-INR
INR: 0.99
Prothrombin Time: 13 seconds (ref 11.4–15.2)

## 2017-12-31 LAB — TYPE AND SCREEN
ABO/RH(D): O POS
ANTIBODY SCREEN: NEGATIVE

## 2017-12-31 LAB — SURGICAL PCR SCREEN
MRSA, PCR: NEGATIVE
STAPHYLOCOCCUS AUREUS: NEGATIVE

## 2017-12-31 NOTE — Pre-Procedure Instructions (Signed)
Benjamin AthensDavid K Jenkins  12/31/2017      [id number] [address]- [place], [place] - [phone] AT [facility] [address] [place] [address] Phone: [phone] Fax: [phone]    Your procedure is scheduled on 01-06-2018  Thursday .  [facility] Ad[facility] at 5:30 A.M.   Call this number if you have problems the morning of surgery:  [phone]   Remember:  Do not eat or drink after midnight.   .                     Take these medicines the morning of surgery with A SIP OF WATER   Atorvastatin(Lipitor) Atrovent Inhaler STOP TAKING ANY ASPIRIN(UNLESS OTHERWISE INSTRUCTED BY YOUR SURGEON),ANTIINFLAMATORIES (IBUPROFEN,ALEVE,MOTRIN,ADVIL,GOODY'S POWDERS),HERBAL SUPPLEMENTS,FISH OIL,AND VITAMINS 5-7 DAYS PRIOR TO SURGERY      Do not wear jewelry, make-up or nail polish.  Do not wear lotions, powders, or perfumes, or deodorant.  Do not shave 48 hours prior to surgery.  Men may shave face and neck.  Do not bring valuables to the hospital.  [facility] is not responsible for any belongings or valuables.  Contacts, dentures or bridgework may not be worn into surgery.  Leave your suitcase in the car.  After surgery it may be brought to your room.  For patients admitted to the hospital, discharge time will be determined by your treatment team.  Patients discharged the day of surgery will not be allowed to drive home.   [facility] - Preparing for Surgery  Before surgery, you can play an important role.  Because skin is not sterile, your skin needs to be as free of germs as possible.  You can reduce the number of germs on you skin by washing with CHG (chlorahexidine gluconate) soap before surgery.  CHG is an antiseptic cleaner which kills germs and bonds with the skin to continue killing germs even after washing.  Oral Hygiene is also important in reducing the risk of infection.  Remember to brush your teeth with your regular  toothpaste the morning of surgery.  Please DO NOT use if you have an allergy to CHG or antibacterial soaps.  If your skin becomes reddened/irritated stop using the CHG and inform your nurse when you arrive at Short Stay.  Do not shave (including legs and underarms) for at least 48 hours prior to the first CHG shower.  You may shave your face.  Please follow these instructions carefully:   1.  Shower with CHG Soap the night before surgery and the morning of Surgery.  2.  If you choose to wash your hair, wash your hair first as usual with your normal shampoo.  3.  After you shampoo, rinse your hair and body thoroughly to remove the shampoo. 4.  Use CHG as you would any other liquid soap.  You can apply chg directly to the skin and wash gently with a      scrungie or washcloth.           5.  Apply the CHG Soap to your body ONLY FROM THE NECK DOWN.   Do not use on open wounds or open sores. Avoid contact with your eyes, ears, mouth and genitals (private parts).  Wash genitals (private parts) with your normal soap.  6.  Wash thoroughly, paying special attention to the area where your surgery will be performed.  7.  Thoroughly rinse your body with warm water from the  neck down.  8.  DO NOT shower/wash with your normal soap after using and rinsing off the CHG Soap.  9.  Pat yourself dry with a clean towel.            10.  Wear clean pajamas.            11.  Place clean sheets on your bed the night of your first shower and do not sleep with pets.  Day of Surgery  Do not apply any lotions/deoderants the morning of surgery.   Please wear clean clothes to the hospital/surgery center. Remember to brush your teeth with toothpaste.    Please read over the following fact sheets that you were given. Pain Booklet, Coughing and Deep Breathing, MRSA Information and Surgical Site Infection Prevention

## 2018-01-03 ENCOUNTER — Encounter (HOSPITAL_COMMUNITY): Payer: Self-pay

## 2018-01-03 NOTE — Progress Notes (Signed)
Anesthesia Chart Review:   Case:  [id number] Date/Time:  01/06/18 0715   Procedure:  BILATERAL FEMORAL ARTERY ANEURYSM REPAIR (Bilateral )   Anesthesia type:  General   Pre-op diagnosis:  BILATERAL FEMORAL ARTERY ANEURYSM   Location:  [facility] OR ROOM 16 / [facility] OR   Surgeon:  Nada LibmanBrabham, Vance W, MD      DISCUSSION: - Pt is a 62 year old male with hx COPD  - Has cardiac clearance for surgery   VS: BP 120/72   Pulse 68   Temp 36.9 C (Oral)   Resp 18   Ht 5\' 9"  (1.753 m)   Wt 139 lb 6 oz (63.2 kg)   SpO2 99%   BMI 20.58 kg/m    PROVIDERS: Saw cardiologist Florian Buffhandra Vyas, MD at [facility] on 12/10/17 and cleared for surgery   LABS: Labs reviewed: Acceptable for surgery. (all labs ordered are listed, but only abnormal results are displayed)  Labs Reviewed  URINALYSIS, ROUTINE W REFLEX MICROSCOPIC - Abnormal; Notable for the following components:      Result Value   Color, Urine STRAW (*)    Specific Gravity, Urine 1.003 (*)    Hgb urine dipstick MODERATE (*)    All other components within normal limits  SURGICAL PCR SCREEN  APTT  CBC  COMPREHENSIVE METABOLIC PANEL  PROTIME-INR  TYPE AND SCREEN  ABO/RH     IMAGES:  CTA abdomen, pelvis 11/03/17:  - VASCULAR 1. Minimal amount of atherosclerotic plaque within a tortuous but normal caliber abdominal aorta. No abdominal aortic dissection or periaortic stranding. 2. 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 All aneurysms contain a moderate amount of crescentic mural thrombus, however there is no evidence perivascular stranding or contrast extravasation. 3. Rather diffuse arteriomegaly with mild ectasia the SMA, bilateral common and external iliac arteries. 4. Nonspecific beaded irregularity involving the common hepatic  artery potentially indicative of FMD. - NON-VASCULAR 1. No acute findings within the abdomen or pelvis. Specifically, the major mesenteric branch vessels of the abdominal aorta appear patent without evidence of a hemodynamically significant stenosis or distal embolism.   EKG 02/25/17: Sinus bradycardia (59 bpm) with sinus arrhythmia   CV:  Cardiac cath 02/25/17:  - Normal LV function without focal segmental wall motion abnormalities and ejection fraction of 55-60%. - Normal coronary arteries in a codominant system.   Past Medical History:  Diagnosis Date  . Anxiety   . Arthritis   . COPD (chronic obstructive pulmonary disease) (HCC)   . Dyspnea   . Headache    hx. migraines,sinus headaches  . Peripheral vascular disease (HCC)   . Vertigo     Past Surgical History:  Procedure Laterality Date  . arm surgery Right in teens   torn muscle  . arthroscopic knee Bilateral 1990's  . BACK SURGERY    . LEFT HEART CATH AND CORONARY ANGIOGRAPHY N/A 02/25/2017   Procedure: LEFT HEART CATH AND CORONARY ANGIOGRAPHY;  Surgeon: Lennette BihariKelly, Thomas A, MD;  Location: [facility] INVASIVE CV LAB;  Service: Cardiovascular;  Laterality: N/A;    MEDICATIONS: . aspirin 325 MG EC tablet  . atorvastatin (LIPITOR) 10 MG tablet  . ATROVENT HFA 17 MCG/ACT inhaler  . Cholecalciferol (VITAMIN D-3) 5000 units TABS  . Coenzyme Q10 (COQ10) 100 MG CAPS  . Krill Oil 500  MG CAPS  . Multiple Vitamin (MULTIVITAMIN WITH MINERALS) TABS tablet  . nitroGLYCERIN (NITROSTAT) 0.4 MG SL tablet   No current facility-administered medications for this encounter.     If no changes, I anticipate pt can proceed with surgery as scheduled.   Rica Mast, FNP-BC [facility] Short Stay Surgical Center/Anesthesiology Phone: [phone] 01/03/2018 10:32 AM

## 2018-01-06 ENCOUNTER — Inpatient Hospital Stay (HOSPITAL_COMMUNITY): Payer: 59 | Admitting: Emergency Medicine

## 2018-01-06 ENCOUNTER — Encounter (HOSPITAL_COMMUNITY)
Admission: RE | Disposition: A | Discharge status: Home / Self Care | Payer: Self-pay | Source: Home / Self Care | Attending: Surgery

## 2018-01-06 ENCOUNTER — Telehealth: Payer: Self-pay | Admitting: Surgery

## 2018-01-06 ENCOUNTER — Inpatient Hospital Stay (HOSPITAL_COMMUNITY): Payer: 59 | Admitting: Anesthesiology

## 2018-01-06 ENCOUNTER — Encounter (HOSPITAL_COMMUNITY): Payer: Self-pay

## 2018-01-06 ENCOUNTER — Inpatient Hospital Stay (HOSPITAL_COMMUNITY)
Admission: RE | Admit: 2018-01-06 | Discharge: 2018-01-08 | DRG: 272 | Disposition: A | Discharge status: Home / Self Care | Payer: 59 | Attending: Surgery | Admitting: Surgery

## 2018-01-06 DIAGNOSIS — E7800 Pure hypercholesterolemia, unspecified: Secondary | ICD-10-CM | POA: Diagnosis present

## 2018-01-06 DIAGNOSIS — M1990 Unspecified osteoarthritis, unspecified site: Secondary | ICD-10-CM | POA: Diagnosis present

## 2018-01-06 DIAGNOSIS — K429 Umbilical hernia without obstruction or gangrene: Secondary | ICD-10-CM | POA: Diagnosis present

## 2018-01-06 DIAGNOSIS — J449 Chronic obstructive pulmonary disease, unspecified: Secondary | ICD-10-CM | POA: Diagnosis present

## 2018-01-06 DIAGNOSIS — Z79899 Other long term (current) drug therapy: Secondary | ICD-10-CM

## 2018-01-06 DIAGNOSIS — Z23 Encounter for immunization: Secondary | ICD-10-CM

## 2018-01-06 DIAGNOSIS — F419 Anxiety disorder, unspecified: Secondary | ICD-10-CM | POA: Diagnosis present

## 2018-01-06 DIAGNOSIS — I2510 Atherosclerotic heart disease of native coronary artery without angina pectoris: Secondary | ICD-10-CM | POA: Diagnosis present

## 2018-01-06 DIAGNOSIS — Z8249 Family history of ischemic heart disease and other diseases of the circulatory system: Secondary | ICD-10-CM

## 2018-01-06 DIAGNOSIS — Z87891 Personal history of nicotine dependence: Secondary | ICD-10-CM

## 2018-01-06 DIAGNOSIS — I739 Peripheral vascular disease, unspecified: Secondary | ICD-10-CM | POA: Diagnosis present

## 2018-01-06 DIAGNOSIS — I724 Aneurysm of artery of lower extremity: Secondary | ICD-10-CM | POA: Diagnosis present

## 2018-01-06 HISTORY — PX: FEMORAL ARTERY EXPLORATION: SHX5160

## 2018-01-06 LAB — CBC
HCT: 40.4 % (ref 39.0–52.0)
Hemoglobin: 13.9 g/dL (ref 13.0–17.0)
MCH: 33.2 pg (ref 26.0–34.0)
MCHC: 34.4 g/dL (ref 30.0–36.0)
MCV: 96.4 fL (ref 78.0–100.0)
Platelets: 231 10*3/uL (ref 150–400)
RBC: 4.19 MIL/uL — AB (ref 4.22–5.81)
RDW: 12.5 % (ref 11.5–15.5)
WBC: 12.3 10*3/uL — AB (ref 4.0–10.5)

## 2018-01-06 LAB — POCT ACTIVATED CLOTTING TIME
ACTIVATED CLOTTING TIME: 202 s
ACTIVATED CLOTTING TIME: 219 s

## 2018-01-06 LAB — CREATININE, SERUM
Creatinine, Ser: 0.92 mg/dL (ref 0.61–1.24)
GFR calc non Af Amer: >60 mL/min (ref >60)

## 2018-01-06 SURGERY — EXPLORATION, ARTERY, FEMORAL
Anesthesia: General | Laterality: Bilateral

## 2018-01-06 MED ORDER — LABETALOL HCL 5 MG/ML IV SOLN
10.0000 mg | INTRAVENOUS | Status: DC | PRN
  Filled 2018-01-06: qty 4

## 2018-01-06 MED ORDER — PROTAMINE SULFATE 10 MG/ML IV SOLN
INTRAVENOUS | Status: DC | PRN
  Administered 2018-01-06: 50 mg via INTRAVENOUS

## 2018-01-06 MED ORDER — CHLORHEXIDINE GLUCONATE 4 % EX LIQD: 60.0000 mL | Freq: Once | CUTANEOUS | Status: DC

## 2018-01-06 MED ORDER — ACETAMINOPHEN 325 MG PO TABS: 325.0000 mg | ORAL_TABLET | ORAL | Status: DC | PRN

## 2018-01-06 MED ORDER — PHENYLEPHRINE HCL 10 MG/ML IJ SOLN
INTRAMUSCULAR | Status: DC | PRN
  Administered 2018-01-06: 50 ug/min via INTRAVENOUS

## 2018-01-06 MED ORDER — HYDROMORPHONE HCL 1 MG/ML IJ SOLN
INTRAMUSCULAR | Status: AC
  Administered 2018-01-06: 15:00:00
  Filled 2018-01-06: qty 1

## 2018-01-06 MED ORDER — ONDANSETRON HCL 4 MG/2ML IJ SOLN: 4.0000 mg | Freq: Once | INTRAMUSCULAR | Status: DC | PRN

## 2018-01-06 MED ORDER — LACTATED RINGERS IV SOLN
INTRAVENOUS | Status: DC | PRN
  Administered 2018-01-06: 07:00:00 via INTRAVENOUS

## 2018-01-06 MED ORDER — OXYCODONE HCL 5 MG PO TABS
5.0000 mg | ORAL_TABLET | Freq: Once | ORAL | Status: AC | PRN
  Administered 2018-01-06: 5 mg via ORAL

## 2018-01-06 MED ORDER — FENTANYL CITRATE (PF) 250 MCG/5ML IJ SOLN
INTRAMUSCULAR | Status: AC
  Filled 2018-01-06: qty 5

## 2018-01-06 MED ORDER — FENTANYL CITRATE (PF) 100 MCG/2ML IJ SOLN
25.0000 ug | INTRAMUSCULAR | Status: DC | PRN
  Administered 2018-01-06 (×3): 50 ug via INTRAVENOUS

## 2018-01-06 MED ORDER — PANTOPRAZOLE SODIUM 40 MG PO TBEC
40.0000 mg | DELAYED_RELEASE_TABLET | Freq: Every day | ORAL | Status: DC
  Administered 2018-01-07 – 2018-01-08 (×2): 40 mg via ORAL
  Filled 2018-01-06 (×2): qty 1

## 2018-01-06 MED ORDER — SODIUM CHLORIDE 0.9 % IV SOLN
INTRAVENOUS | Status: AC
  Filled 2018-01-06: qty 1.2

## 2018-01-06 MED ORDER — FENTANYL CITRATE (PF) 100 MCG/2ML IJ SOLN
INTRAMUSCULAR | Status: DC | PRN
  Administered 2018-01-06: 100 ug via INTRAVENOUS
  Administered 2018-01-06 (×3): 50 ug via INTRAVENOUS
  Administered 2018-01-06: 100 ug via INTRAVENOUS
  Administered 2018-01-06: 50 ug via INTRAVENOUS

## 2018-01-06 MED ORDER — KRILL OIL 500 MG PO CAPS: 500.0000 mg | ORAL_CAPSULE | Freq: Every day | ORAL | Status: DC

## 2018-01-06 MED ORDER — ALUM & MAG HYDROXIDE-SIMETH 200-200-20 MG/5ML PO SUSP: 15.0000 mL | ORAL | Status: DC | PRN

## 2018-01-06 MED ORDER — OXYCODONE HCL 5 MG/5ML PO SOLN: 5.0000 mg | Freq: Once | ORAL | Status: AC | PRN

## 2018-01-06 MED ORDER — ADULT MULTIVITAMIN W/MINERALS CH
1.0000 | ORAL_TABLET | Freq: Every day | ORAL | Status: DC
  Administered 2018-01-07 – 2018-01-08 (×2): 1 via ORAL
  Filled 2018-01-06 (×2): qty 1

## 2018-01-06 MED ORDER — POTASSIUM CHLORIDE CRYS ER 20 MEQ PO TBCR: 20.0000 meq | EXTENDED_RELEASE_TABLET | Freq: Every day | ORAL | Status: DC | PRN

## 2018-01-06 MED ORDER — PHENOL 1.4 % MT LIQD: 1.0000 | OROMUCOSAL | Status: DC | PRN

## 2018-01-06 MED ORDER — SUGAMMADEX SODIUM 200 MG/2ML IV SOLN
INTRAVENOUS | Status: DC | PRN
  Administered 2018-01-06: 150 mg via INTRAVENOUS

## 2018-01-06 MED ORDER — ONDANSETRON HCL 4 MG/2ML IJ SOLN
INTRAMUSCULAR | Status: AC
  Filled 2018-01-06: qty 2

## 2018-01-06 MED ORDER — ROCURONIUM BROMIDE 100 MG/10ML IV SOLN
INTRAVENOUS | Status: DC | PRN
  Administered 2018-01-06: 15 mg via INTRAVENOUS
  Administered 2018-01-06: 50 mg via INTRAVENOUS

## 2018-01-06 MED ORDER — GUAIFENESIN-DM 100-10 MG/5ML PO SYRP: 15.0000 mL | ORAL_SOLUTION | ORAL | Status: DC | PRN

## 2018-01-06 MED ORDER — HEPARIN SODIUM (PORCINE) 1000 UNIT/ML IJ SOLN
INTRAMUSCULAR | Status: AC
  Filled 2018-01-06: qty 1

## 2018-01-06 MED ORDER — OXYCODONE HCL 5 MG PO TABS
ORAL_TABLET | ORAL | Status: AC
  Administered 2018-01-06: 5 mg via ORAL
  Filled 2018-01-06: qty 1

## 2018-01-06 MED ORDER — POLYETHYLENE GLYCOL 3350 17 G PO PACK
17.0000 g | PACK | Freq: Every day | ORAL | Status: DC | PRN
  Administered 2018-01-07: 17 g via ORAL
  Filled 2018-01-06: qty 1

## 2018-01-06 MED ORDER — SUGAMMADEX SODIUM 200 MG/2ML IV SOLN
INTRAVENOUS | Status: AC
  Filled 2018-01-06: qty 2

## 2018-01-06 MED ORDER — MORPHINE SULFATE (PF) 2 MG/ML IV SOLN
2.0000 mg | INTRAVENOUS | Status: DC | PRN
  Administered 2018-01-06 – 2018-01-07 (×2): 2 mg via INTRAVENOUS
  Filled 2018-01-06 (×2): qty 1

## 2018-01-06 MED ORDER — PROPOFOL 10 MG/ML IV BOLUS
INTRAVENOUS | Status: DC | PRN
  Administered 2018-01-06: 20 mg via INTRAVENOUS
  Administered 2018-01-06: 180 mg via INTRAVENOUS

## 2018-01-06 MED ORDER — HYDRALAZINE HCL 20 MG/ML IJ SOLN
5.0000 mg | INTRAMUSCULAR | Status: DC | PRN
  Filled 2018-01-06: qty 0.25

## 2018-01-06 MED ORDER — DEXAMETHASONE SODIUM PHOSPHATE 10 MG/ML IJ SOLN
INTRAMUSCULAR | Status: DC | PRN
  Administered 2018-01-06: 10 mg via INTRAVENOUS

## 2018-01-06 MED ORDER — SODIUM CHLORIDE 0.9 % IV SOLN: INTRAVENOUS | Status: DC

## 2018-01-06 MED ORDER — DOCUSATE SODIUM 100 MG PO CAPS
100.0000 mg | ORAL_CAPSULE | Freq: Every day | ORAL | Status: DC
  Administered 2018-01-07 – 2018-01-08 (×2): 100 mg via ORAL
  Filled 2018-01-06 (×2): qty 1

## 2018-01-06 MED ORDER — FENTANYL CITRATE (PF) 100 MCG/2ML IJ SOLN
INTRAMUSCULAR | Status: AC
  Administered 2018-01-06: 15:00:00
  Filled 2018-01-06: qty 2

## 2018-01-06 MED ORDER — HYDROMORPHONE HCL 1 MG/ML IJ SOLN
0.2500 mg | INTRAMUSCULAR | Status: DC | PRN
  Administered 2018-01-06 (×4): 0.5 mg via INTRAVENOUS

## 2018-01-06 MED ORDER — HEPARIN SODIUM (PORCINE) 5000 UNIT/ML IJ SOLN
5000.0000 [IU] | Freq: Three times a day (TID) | INTRAMUSCULAR | Status: DC
  Administered 2018-01-07 – 2018-01-08 (×4): 5000 [IU] via SUBCUTANEOUS
  Filled 2018-01-06 (×4): qty 1

## 2018-01-06 MED ORDER — PROTAMINE SULFATE 10 MG/ML IV SOLN
INTRAVENOUS | Status: AC
Start: 2018-01-06 — End: ?
  Filled 2018-01-06: qty 5

## 2018-01-06 MED ORDER — SODIUM CHLORIDE 0.9 % IV SOLN
INTRAVENOUS | Status: DC
  Administered 2018-01-06: 16:00:00 via INTRAVENOUS

## 2018-01-06 MED ORDER — DEXAMETHASONE SODIUM PHOSPHATE 10 MG/ML IJ SOLN
INTRAMUSCULAR | Status: AC
  Filled 2018-01-06: qty 1

## 2018-01-06 MED ORDER — ALPRAZOLAM 0.25 MG PO TABS
0.5000 mg | ORAL_TABLET | Freq: Three times a day (TID) | ORAL | Status: DC | PRN
  Administered 2018-01-06 – 2018-01-07 (×3): 0.5 mg via ORAL
  Filled 2018-01-06 (×3): qty 2

## 2018-01-06 MED ORDER — MIDAZOLAM HCL 2 MG/2ML IJ SOLN
INTRAMUSCULAR | Status: AC
  Filled 2018-01-06: qty 2

## 2018-01-06 MED ORDER — SODIUM CHLORIDE 0.9 % IV SOLN
INTRAVENOUS | Status: DC | PRN
  Administered 2018-01-06: 08:00:00

## 2018-01-06 MED ORDER — BISACODYL 10 MG RE SUPP: 10.0000 mg | Freq: Every day | RECTAL | Status: DC | PRN

## 2018-01-06 MED ORDER — HEMOSTATIC AGENTS (NO CHARGE) OPTIME
TOPICAL | Status: DC | PRN
  Administered 2018-01-06: 1 via TOPICAL

## 2018-01-06 MED ORDER — ASPIRIN EC 325 MG PO TBEC
325.0000 mg | DELAYED_RELEASE_TABLET | Freq: Every day | ORAL | Status: DC
  Administered 2018-01-07 – 2018-01-08 (×2): 325 mg via ORAL
  Filled 2018-01-06 (×2): qty 1

## 2018-01-06 MED ORDER — HEPARIN SODIUM (PORCINE) 1000 UNIT/ML IJ SOLN
INTRAMUSCULAR | Status: DC | PRN
  Administered 2018-01-06: 6000 [IU] via INTRAVENOUS
  Administered 2018-01-06: 2000 [IU] via INTRAVENOUS

## 2018-01-06 MED ORDER — ROCURONIUM BROMIDE 50 MG/5ML IV SOLN
INTRAVENOUS | Status: AC
  Filled 2018-01-06: qty 1

## 2018-01-06 MED ORDER — PHENYLEPHRINE 40 MCG/ML (10ML) SYRINGE FOR IV PUSH (FOR BLOOD PRESSURE SUPPORT)
PREFILLED_SYRINGE | INTRAVENOUS | Status: AC
  Filled 2018-01-06: qty 10

## 2018-01-06 MED ORDER — NITROGLYCERIN 0.4 MG SL SUBL: 0.4000 mg | SUBLINGUAL_TABLET | SUBLINGUAL | Status: DC | PRN

## 2018-01-06 MED ORDER — ATORVASTATIN CALCIUM 10 MG PO TABS
10.0000 mg | ORAL_TABLET | Freq: Every day | ORAL | Status: DC
  Administered 2018-01-07 – 2018-01-08 (×2): 10 mg via ORAL
  Filled 2018-01-06 (×2): qty 1

## 2018-01-06 MED ORDER — MAGNESIUM SULFATE 2 GM/50ML IV SOLN: 2.0000 g | Freq: Every day | INTRAVENOUS | Status: DC | PRN

## 2018-01-06 MED ORDER — LIDOCAINE HCL (CARDIAC) PF 100 MG/5ML IV SOSY
PREFILLED_SYRINGE | INTRAVENOUS | Status: DC | PRN
  Administered 2018-01-06: 40 mg via INTRAVENOUS

## 2018-01-06 MED ORDER — OXYCODONE-ACETAMINOPHEN 5-325 MG PO TABS
1.0000 | ORAL_TABLET | ORAL | Status: DC | PRN
  Administered 2018-01-06 – 2018-01-07 (×3): 2 via ORAL
  Administered 2018-01-07 (×4): 1 via ORAL
  Administered 2018-01-08: 2 via ORAL
  Filled 2018-01-06: qty 1
  Filled 2018-01-06 (×4): qty 2
  Filled 2018-01-06: qty 1
  Filled 2018-01-06: qty 2
  Filled 2018-01-06: qty 1

## 2018-01-06 MED ORDER — COQ10 100 MG PO CAPS: 100.0000 mg | ORAL_CAPSULE | Freq: Every day | ORAL | Status: DC

## 2018-01-06 MED ORDER — IPRATROPIUM BROMIDE HFA 17 MCG/ACT IN AERS: 2.0000 | INHALATION_SPRAY | Freq: Every morning | RESPIRATORY_TRACT | Status: DC

## 2018-01-06 MED ORDER — LIDOCAINE 2% (20 MG/ML) 5 ML SYRINGE
INTRAMUSCULAR | Status: AC
  Filled 2018-01-06: qty 15

## 2018-01-06 MED ORDER — PHENYLEPHRINE HCL 10 MG/ML IJ SOLN
INTRAMUSCULAR | Status: DC | PRN
  Administered 2018-01-06 (×2): 80 ug via INTRAVENOUS

## 2018-01-06 MED ORDER — MIDAZOLAM HCL 5 MG/5ML IJ SOLN
INTRAMUSCULAR | Status: DC | PRN
  Administered 2018-01-06: 2 mg via INTRAVENOUS

## 2018-01-06 MED ORDER — SODIUM CHLORIDE 0.9 % IV SOLN: 500.0000 mL | Freq: Once | INTRAVENOUS | Status: DC | PRN

## 2018-01-06 MED ORDER — PROPOFOL 10 MG/ML IV BOLUS
INTRAVENOUS | Status: AC
  Filled 2018-01-06: qty 20

## 2018-01-06 MED ORDER — ONDANSETRON HCL 4 MG/2ML IJ SOLN: 4.0000 mg | Freq: Four times a day (QID) | INTRAMUSCULAR | Status: DC | PRN

## 2018-01-06 MED ORDER — CEFAZOLIN SODIUM-DEXTROSE 2-4 GM/100ML-% IV SOLN
2.0000 g | Freq: Three times a day (TID) | INTRAVENOUS | Status: AC
  Administered 2018-01-06 (×2): 2 g via INTRAVENOUS
  Filled 2018-01-06 (×2): qty 100

## 2018-01-06 MED ORDER — EPHEDRINE SULFATE 50 MG/ML IJ SOLN
INTRAMUSCULAR | Status: AC
  Filled 2018-01-06: qty 1

## 2018-01-06 MED ORDER — PAPAVERINE HCL 30 MG/ML IJ SOLN
INTRAMUSCULAR | Status: AC
  Filled 2018-01-06: qty 2

## 2018-01-06 MED ORDER — 0.9 % SODIUM CHLORIDE (POUR BTL) OPTIME
TOPICAL | Status: DC | PRN
  Administered 2018-01-06: 2000 mL

## 2018-01-06 MED ORDER — MIDAZOLAM HCL 2 MG/2ML IJ SOLN
1.0000 mg | Freq: Once | INTRAMUSCULAR | Status: AC
  Administered 2018-01-06: 0.5 mg via INTRAVENOUS

## 2018-01-06 MED ORDER — MIDAZOLAM HCL 2 MG/2ML IJ SOLN
INTRAMUSCULAR | Status: AC
  Administered 2018-01-06: 15:00:00
  Filled 2018-01-06: qty 2

## 2018-01-06 MED ORDER — ACETAMINOPHEN 325 MG RE SUPP: 325.0000 mg | RECTAL | Status: DC | PRN

## 2018-01-06 MED ORDER — LACTATED RINGERS IV SOLN
INTRAVENOUS | Status: DC | PRN
  Administered 2018-01-06: 08:00:00 via INTRAVENOUS

## 2018-01-06 MED ORDER — METOPROLOL TARTRATE 5 MG/5ML IV SOLN: 2.0000 mg | INTRAVENOUS | Status: DC | PRN

## 2018-01-06 MED ORDER — ONDANSETRON HCL 4 MG/2ML IJ SOLN
INTRAMUSCULAR | Status: DC | PRN
  Administered 2018-01-06: 4 mg via INTRAVENOUS

## 2018-01-06 MED ORDER — CEFAZOLIN SODIUM-DEXTROSE 2-4 GM/100ML-% IV SOLN
2.0000 g | INTRAVENOUS | Status: AC
  Administered 2018-01-06: 2 g via INTRAVENOUS
  Filled 2018-01-06: qty 100

## 2018-01-06 MED ORDER — VITAMIN D-3 125 MCG (5000 UT) PO TABS: 5000.0000 [IU] | ORAL_TABLET | Freq: Every day | ORAL | Status: DC

## 2018-01-06 SURGICAL SUPPLY — 63 items
ADH SKN CLS APL DERMABOND .7 (GAUZE/BANDAGES/DRESSINGS) ×2
BANDAGE ACE 4X5 VEL STRL LF (GAUZE/BANDAGES/DRESSINGS) IMPLANT
BANDAGE ESMARK 6X9 LF (GAUZE/BANDAGES/DRESSINGS) IMPLANT
BNDG CMPR 9X6 STRL LF SNTH (GAUZE/BANDAGES/DRESSINGS)
BNDG ESMARK 6X9 LF (GAUZE/BANDAGES/DRESSINGS)
CANISTER SUCT 3000ML PPV (MISCELLANEOUS) ×3 IMPLANT
CLIP VESOCCLUDE MED 24/CT (CLIP) ×5 IMPLANT
CLIP VESOCCLUDE SM WIDE 24/CT (CLIP) ×3 IMPLANT
CUFF TOURNIQUET SINGLE 24IN (TOURNIQUET CUFF) IMPLANT
CUFF TOURNIQUET SINGLE 34IN LL (TOURNIQUET CUFF) IMPLANT
CUFF TOURNIQUET SINGLE 44IN (TOURNIQUET CUFF) IMPLANT
DERMABOND ADVANCED (GAUZE/BANDAGES/DRESSINGS) ×4
DERMABOND ADVANCED .7 DNX12 (GAUZE/BANDAGES/DRESSINGS) ×1 IMPLANT
DRAIN CHANNEL 15F RND FF W/TCR (WOUND CARE) IMPLANT
DRAPE X-RAY CASS 24X20 (DRAPES) IMPLANT
ELECT CAUTERY BLADE 6.4 (BLADE) ×2 IMPLANT
ELECT REM PT RETURN 9FT ADLT (ELECTROSURGICAL) ×6
ELECTRODE REM PT RTRN 9FT ADLT (ELECTROSURGICAL) ×1 IMPLANT
EVACUATOR SILICONE 100CC (DRAIN) IMPLANT
GLOVE BIO SURGEON STRL SZ 6.5 (GLOVE) ×2 IMPLANT
GLOVE BIO SURGEONS STRL SZ 6.5 (GLOVE) ×2
GLOVE BIOGEL PI IND STRL 7.5 (GLOVE) ×1 IMPLANT
GLOVE BIOGEL PI INDICATOR 7.5 (GLOVE) ×6
GLOVE INDICATOR 7.5 STRL GRN (GLOVE) ×2 IMPLANT
GLOVE SURG SS PI 7.5 STRL IVOR (GLOVE) ×3 IMPLANT
GOWN STRL REUS W/ TWL LRG LVL3 (GOWN DISPOSABLE) ×2 IMPLANT
GOWN STRL REUS W/ TWL XL LVL3 (GOWN DISPOSABLE) ×1 IMPLANT
GOWN STRL REUS W/TWL LRG LVL3 (GOWN DISPOSABLE) ×9
GOWN STRL REUS W/TWL XL LVL3 (GOWN DISPOSABLE) ×6
GRAFT CV 30X10STRG TUBE (Vascular Products) IMPLANT
GRAFT HEMASHIELD 10MM (Vascular Products) ×3 IMPLANT
HEMOSTAT SNOW SURGICEL 2X4 (HEMOSTASIS) IMPLANT
KIT BASIN OR (CUSTOM PROCEDURE TRAY) ×3 IMPLANT
KIT TURNOVER KIT B (KITS) ×3 IMPLANT
LOOP VESSEL MAXI BLUE (MISCELLANEOUS) ×6 IMPLANT
LOOP VESSEL MINI RED (MISCELLANEOUS) ×4 IMPLANT
MARKER GRAFT CORONARY BYPASS (MISCELLANEOUS) IMPLANT
NS IRRIG 1000ML POUR BTL (IV SOLUTION) ×6 IMPLANT
PACK PERIPHERAL VASCULAR (CUSTOM PROCEDURE TRAY) ×3 IMPLANT
PAD ARMBOARD 7.5X6 YLW CONV (MISCELLANEOUS) ×6 IMPLANT
PENCIL BUTTON HOLSTER BLD 10FT (ELECTRODE) ×2 IMPLANT
PUNCH AORTIC ROTATE 5MM 8IN (MISCELLANEOUS) ×2 IMPLANT
SET COLLECT BLD 21X3/4 12 (NEEDLE) IMPLANT
SPONGE LAP 18X18 X RAY DECT (DISPOSABLE) ×4 IMPLANT
STOPCOCK 4 WAY LG BORE MALE ST (IV SETS) IMPLANT
SUT ETHILON 3 0 PS 1 (SUTURE) IMPLANT
SUT PROLENE 5 0 C 1 24 (SUTURE) ×19 IMPLANT
SUT PROLENE 5 0 C 1 36 (SUTURE) ×2 IMPLANT
SUT PROLENE 6 0 BV (SUTURE) ×7 IMPLANT
SUT PROLENE 6 0 CC (SUTURE) ×2 IMPLANT
SUT PROLENE 7 0 BV 1 (SUTURE) IMPLANT
SUT SILK 3 0 (SUTURE)
SUT SILK 3-0 18XBRD TIE 12 (SUTURE) IMPLANT
SUT VIC AB 2-0 CT1 27 (SUTURE) ×6
SUT VIC AB 2-0 CT1 TAPERPNT 27 (SUTURE) ×2 IMPLANT
SUT VIC AB 3-0 SH 27 (SUTURE) ×6
SUT VIC AB 3-0 SH 27X BRD (SUTURE) ×2 IMPLANT
SUT VICRYL 4-0 PS2 18IN ABS (SUTURE) ×6 IMPLANT
TOWEL GREEN STERILE (TOWEL DISPOSABLE) ×3 IMPLANT
TRAY FOLEY MTR SLVR 16FR STAT (SET/KITS/TRAYS/PACK) ×1 IMPLANT
TUBING EXTENTION W/L.L. (IV SETS) IMPLANT
UNDERPAD 30X30 (UNDERPADS AND DIAPERS) ×1 IMPLANT
WATER STERILE IRR 1000ML POUR (IV SOLUTION) ×3 IMPLANT

## 2018-01-06 NOTE — Op Note (Signed)
Patient name: Benjamin BlossomDavid K Racz MRN: [id number] DOB: 06/15/1956 Sex: male  01/06/2018 Pre-operative Diagnosis: Bilateral common femoral artery aneurysms Post-operative diagnosis:  Same Surgeon:  Apolinar JunesBrandon C. Randie Heinzain, MD Co-surgeon: Durene CalWells Brabham, MD Assistants: Doreatha MassedSamantha Rhyne, PA; Clinton GallantEmma Collins, [place] Procedure Performed: Repair of right common femoral artery aneurysm with interposition 10 mm dacron graft  Indications: 62 year old male followed by Dr. Myra GianottiBrabham for bilateral common femoral artery aneurysms.  They have reached indication for repair.  We are proceeding as co-surgeons due to the complexity of the case there was assistant necessary for each of us to help with dissection of the bilateral common femoral aneurysms as well as anastomosis of our graft.  Findings: On the right side there was a large common femoral aneurysm and a large profunda femoris artery coming off of it that was nondiseased.  The inflow from the external iliac artery was approximately 12 mm in the outflow was 8 to 10 mm in the SFA.  Interposition 10 mm Dacron graft was placed in the profunda femoris artery was anastomosed to the side of this graft.  Completion there were strong signals in the runoff and palpable pedal pulses bilaterally.   Procedure:  The patient was identified in the holding area and taken to the operating room where he was placed supine operating table and general anesthesia was induced.  He was sterilely prepped and draped in the bilateral groins given antibiotics and a timeout was called.  Concomitant bilateral groin dissections and proceeded with bilateral common femoral artery repair.  For the left side please see Dr. Estanislado SpireBrabham's full operative dictated note.  On the right side I made a longitudinal incision dissected down first of proximal control around the external iliac artery which was healthy although greater than a centimeter in size.  We then dissected out the aneurysm down onto the SFA placed a  vessel loop around this.  Then dissected the profunda femoris artery from both sides and placed individual Vesseloops around the branches.  We had both dissected the bilateral common femoral aneurysms patient was fully heparinized.  This time I clamped the outflow followed by the inflow and the Vesseloops perception on the profunda branches.  The aneurysm was opened longitudinally and then was excised totally.  A 10 mm Dacron graft was then sewn end-to-end first to the proximal anastomosis and this was flushed through the graft.  It was then trimmed to size and sewn end-to-end to the outflow of the SFA.  Prior to completing this anastomosis we allowed flushing techniques and flushed the graft with heparinized saline.  We then had good pulsatility through the graft.  I lined up the profunda femoris artery and then clamped the graft on either side.  The graft was then opened on the lateral side of it and a small piece was trimmed out of the dacryon graft.  I cleaned up the edges of the profunda and sewed it as a Carrell patch with 6-0 Prolene suture.  Again we allowed flushing techniques prior to completing.  Upon completion there was strong Doppler signals in both the profunda and SFA.  We obtained hemostasis and after we both finished suturing patient was given 50 mg of protamine which he tolerated well.  We then irrigated the wound and closed in layers with Vicryl and Monocryl and Dermabond was placed at the skin.  He had palpable pedal pulses bilaterally.  He was allowed away from anesthesia having tolerated procedure well without immediate complication.  Next  EBL 450 cc.  Kahlyn Shippey C. Donzetta Matters, MD [facility] and Vein Specialists of [place] Office: [phone] Pager: [phone]

## 2018-01-06 NOTE — H&P (Signed)
Vascular and Vein Specialist of [facility]  Patient name: Benjamin Jenkins           MRN: [id number]        DOB: 1955-12-03        Sex: male   REQUESTING PROVIDER:    ER   REASON FOR CONSULT:    Femoral aneurysms  HISTORY OF PRESENT ILLNESS:   Benjamin Jenkins is a 62 y.o. male, who presented to the emergency department on 11/03/2017 for evaluation of abdominal pain which he described as sharp and tearing he underwent a CT scan to evaluate for arterial pathology.  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  His reason for abdominal pain was felt to be a umbilical hernia which he is being evaluated for but is waiting on Workmen's Comp.  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  He is medically managed for hypercholesterolemia with a statin.  He is followed by Dr. Jacinto Halim for his coronary artery disease.  PAST MEDICAL HISTORY        Past Medical History:  Diagnosis Date  . COPD (chronic obstructive pulmonary disease) (HCC)   . Coronary artery disease      FAMILY HISTORY        Family History  Problem Relation Age of Onset  . Heart disease Mother     SOCIAL HISTORY:   Social History        Socioeconomic History  . Marital status: Single    Spouse name: Not on file  . Number of children: Not on file  . Years of education: Not on file  . Highest education level: Not on file  Occupational History  . Not on file  Social Needs  . Financial resource strain: Not on file  . Food insecurity:    Worry: Not on file    Inability: Not on file  . Transportation needs:    Medical: Not on file    Non-medical: Not on file  Tobacco Use  . Smoking status: Former Smoker    Packs/day: 0.50    Years: 20.00    Pack years: 10.00    Types: Cigarettes  . Smokeless tobacco: Former Neurosurgeon    Quit date: 07/21/1988  Substance and Sexual Activity  . Alcohol use: Yes    Alcohol/week: 0.6 -  1.8 oz    Types: 1 - 3 Cans of beer per week    Comment: nightly  . Drug use: No  . Sexual activity: Not on file  Lifestyle  . Physical activity:    Days per week: Not on file    Minutes per session: Not on file  . Stress: Not on file  Relationships  . Social connections:    Talks on phone: Not on file    Gets together: Not on file    Attends religious service: Not on file    Active member of club or organization: Not on file    Attends meetings of clubs or organizations: Not on file    Relationship status: Not on file  . Intimate partner violence:    Fear of current or ex partner: Not on file    Emotionally abused: Not on file    Physically abused: Not on file    Forced sexual activity: Not on file  Other Topics Concern  . Not on file  Social History Narrative  . Not on file    ALLERGIES:    No Known Allergies  CURRENT MEDICATIONS:  Current Outpatient Medications  Medication Sig Dispense Refill  . atorvastatin (LIPITOR) 10 MG tablet Take 1 tablet (10 mg total) by mouth daily. 30 tablet 11  . ATROVENT HFA 17 MCG/ACT inhaler TAKE 2 PUFFS BY MOUTH EVERY 6 HOURS AS NEEDED FOR WHEEZE (Patient taking differently: Inhale 2 puffs into the lungs in the morning) 12.9 Inhaler 0  . Cholecalciferol (VITAMIN D-3) 5000 units TABS Take 5,000 Units by mouth daily.    . Coenzyme Q10 (COQ10) 100 MG CAPS Take 100 mg by mouth daily.    [name] 500 MG CAPS Take 500 mg by mouth daily.    . Multiple Vitamin (MULTIVITAMIN WITH MINERALS) TABS tablet Take 1 tablet by mouth daily.    . nitroGLYCERIN (NITROSTAT) 0.4 MG SL tablet Place 1 tablet (0.4 mg total) under the tongue every 5 (five) minutes as needed for chest pain. 25 tablet 11  . TURMERIC PO Take 1 capsule by mouth daily.      No current facility-administered medications for this visit.     REVIEW OF SYSTEMS:   [X]  denotes positive finding, [ ]  denotes negative  finding Cardiac  Comments:  Chest pain or chest pressure:    Shortness of breath upon exertion: x   Short of breath when lying flat:    Irregular heart rhythm:        Vascular    Pain in calf, thigh, or hip brought on by ambulation: x   Pain in feet at night that wakes you up from your sleep:     Blood clot in your veins:    Leg swelling:         Pulmonary    Oxygen at home:    Productive cough:     Wheezing:         Neurologic    Sudden weakness in arms or legs:     Sudden numbness in arms or legs:     Sudden onset of difficulty speaking or slurred speech:    Temporary loss of vision in one eye:     Problems with dizziness:         Gastrointestinal    Blood in stool:      Vomited blood:         Genitourinary    Burning when urinating:     Blood in urine:        Psychiatric    Major depression:         Hematologic    Bleeding problems:    Problems with blood clotting too easily:        Skin    Rashes or ulcers:        Constitutional    Fever or chills:     PHYSICAL EXAM:   There were no vitals filed for this visit.  GENERAL: The patient is a well-nourished male, in no acute distress. The vital signs are documented above. CARDIAC: There is a regular rate and rhythm.  VASCULAR: Prominent femoral pulse bilaterally.  Palpable pedal pulses. PULMONARY: Nonlabored respirations ABDOMEN: Soft and non-tender with normal pitched bowel sounds.  MUSCULOSKELETAL: There are no major deformities or cyanosis. NEUROLOGIC: No focal weakness or paresthesias are detected. SKIN: There are no ulcers or rashes noted. PSYCHIATRIC: The patient has a normal affect.  STUDIES:   I have reviewed his CT angiogram of the following findings:  1. Minimal amount of atherosclerotic plaque within a tortuous but normal caliber abdominal aorta. No abdominal aortic dissection or periaortic  stranding.  2. 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 All aneurysms contain a moderate amount of crescentic mural thrombus, however there is no evidence perivascular stranding or contrast extravasation. 3. Rather diffuse arteriomegaly with mild ectasia the SMA, bilateral common and external iliac arteries. 4. Nonspecific beaded irregularity involving the common hepatic artery potentially indicative of FMD.    ASSESSMENT and PLAN   Bilateral femoral aneurysms: I discussed with the patient that based on the size I would recommend repair.  We discussed the details of the procedure as well as the recovery time.  We will try to arrange this in the same time.  As his umbilical hernia repair.  I told him that I would not do the procedures at the same time but we could do them close together so that his recovery.  Overlap so that he can get back to work as soon as possible.  I am also going to have him cleared from cardiology standpoint.  Patient is going to contact me within the next 4 to 6 weeks to get this scheduled.   Durene CalWells Makailee Nudelman, MD Vascular and Vein Specialists of [facility] Tel [phone] Pager [phone]   Patient now having pain in his left groin. Plan remains to proceed with bilateral femoral aneurysm repair. Al questions answered.  WB

## 2018-01-06 NOTE — Discharge Instructions (Signed)
 Vascular and Vein Specialists of [place]  Discharge instructions  Lower Extremity Bypass Surgery  Please refer to the following instruction for your post-procedure care. Your surgeon or physician assistant will discuss any changes with you.  Activity  You are encouraged to walk as much as you can. You can slowly return to normal activities during the month after your surgery. Avoid strenuous activity and heavy lifting until your doctor tells you it's OK. Avoid activities such as vacuuming or swinging a golf club. Do not drive until your doctor give the OK and you are no longer taking prescription pain medications. It is also normal to have difficulty with sleep habits, eating and bowel movement after surgery. These will go away with time.  Bathing/Showering  Shower daily after you go home. Do not soak in a bathtub, hot tub, or swim until the incision heals completely.  Incision Care  Clean your incision with mild soap and water. Shower every day. Pat the area dry with a clean towel. You do not need a bandage unless otherwise instructed. Do not apply any ointments or creams to your incision. If you have open wounds you will be instructed how to care for them or a visiting nurse may be arranged for you. If you have staples or sutures along your incision they will be removed at your post-op appointment. You may have skin glue on your incision. Do not peel it off. It will come off on its own in about one week.  Wash the groin wound with soap and water daily and pat dry. (No tub bath-only shower)  Then put a dry gauze or washcloth in the groin to keep this area dry to help prevent wound infection.  Do this daily and as needed.  Do not use Vaseline or neosporin on your incisions.  Only use soap and water on your incisions and then protect and keep dry.  Diet  Resume your normal diet. There are no special food restrictions following this procedure. A low fat/ low cholesterol diet is  recommended for all patients with vascular disease. In order to heal from your surgery, it is CRITICAL to get adequate nutrition. Your body requires vitamins, minerals, and protein. Vegetables are the best source of vitamins and minerals. Vegetables also provide the perfect balance of protein. Processed food has little nutritional value, so try to avoid this.  Medications  Resume taking all your medications unless your doctor or physician assistant tells you not to. If your incision is causing pain, you may take over-the-counter pain relievers such as acetaminophen (Tylenol). If you were prescribed a stronger pain medication, please aware these medication can cause nausea and constipation. Prevent nausea by taking the medication with a snack or meal. Avoid constipation by drinking plenty of fluids and eating foods with high amount of fiber, such as fruits, vegetables, and grains. Take Colace 100 mg (an over-the-counter stool softener) twice a day as needed for constipation.  Do not take Tylenol if you are taking prescription pain medications.  Follow Up  Our office will schedule a follow up appointment 2-3 weeks following discharge.  Please call us immediately for any of the following conditions  Severe or worsening pain in your legs or feet while at rest or while walking Increase pain, redness, warmth, or drainage (pus) from your incision site(s) Fever of 101 degree or higher The swelling in your leg with the bypass suddenly worsens and becomes more painful than when you were in the hospital If you have   been instructed to feel your graft pulse then you should do so every day. If you can no longer feel this pulse, call the office immediately. Not all patients are given this instruction.  Leg swelling is common after leg bypass surgery.  The swelling should improve over a few months following surgery. To improve the swelling, you may elevate your legs above the level of your heart while you are  sitting or resting. Your surgeon or physician assistant may ask you to apply an ACE wrap or wear compression (TED) stockings to help to reduce swelling.  Reduce your risk of vascular disease  Stop smoking. If you would like help call [facility] at 1-800-QUIT-NOW (1-800-784-8669) or [facility] at 336-586-4000.  Manage your cholesterol Maintain a desired weight Control your diabetes weight Control your diabetes Keep your blood pressure down  If you have any questions, please call the office at [phone]  

## 2018-01-06 NOTE — Progress Notes (Signed)
Lunch relief by MA Rayya Yagi RN 

## 2018-01-06 NOTE — Progress Notes (Signed)
Vascular and Vein Specialists of [place]  Subjective  - post op check.  Feeling anxious and having groin pain B.   Objective 126/78 66 98.2 F (36.8 C) 10 98%  Intake/Output Summary (Last 24 hours) at 01/06/2018 1417 Last data filed at 01/06/2018 [id number] Gross per 24 hour  Intake 1300 ml  Output 450 ml  Net 850 ml    Palpable DP pulse B, sensation and motor intact B groins soft without hematoma, intact incisions Gen NAD   Assessment/Planning: S/P repair B femoral aneyrysms  Disposition stable pending bed on 4E Xanax order q 8 PRN for anxiety  Benjamin Jenkins 01/06/2018 2:17 PM --  Laboratory Lab Results: No results for input(s): WBC, HGB, HCT, PLT in the last 72 hours. BMET No results for input(s): NA, K, CL, CO2, GLUCOSE, BUN, CREATININE, CALCIUM in the last 72 hours.  COAG Lab Results  Component Value Date   INR 0.99 12/31/2017   INR 0.99 02/25/2017   No results found for: PTT

## 2018-01-06 NOTE — Anesthesia Preprocedure Evaluation (Signed)
Anesthesia Evaluation  Patient identified by MRN, date of birth, ID band Patient awake    Reviewed: Allergy & Precautions, NPO status , Patient's Chart, lab work & pertinent test results  Airway Mallampati: II  TM Distance: >3 FB Neck ROM: Full    Dental  (+) Teeth Intact, Dental Advisory Given   Pulmonary former smoker,    breath sounds clear to auscultation       Cardiovascular  Rhythm:Regular Rate:Normal     Neuro/Psych    GI/Hepatic   Endo/Other    Renal/GU      Musculoskeletal   Abdominal   Peds  Hematology   Anesthesia Other Findings   Reproductive/Obstetrics                             Anesthesia Physical Anesthesia Plan  ASA: III  Anesthesia Plan: General   Post-op Pain Management:    Induction: Intravenous  PONV Risk Score and Plan: Ondansetron and Dexamethasone  Airway Management Planned: Oral ETT  Additional Equipment:   Intra-op Plan:   Post-operative Plan: Extubation in OR  Informed Consent: I have reviewed the patients History and Physical, chart, labs and discussed the procedure including the risks, benefits and alternatives for the proposed anesthesia with the patient or authorized representative who has indicated his/her understanding and acceptance.   Dental advisory given  Plan Discussed with: CRNA and Anesthesiologist  Anesthesia Plan Comments:         Anesthesia Quick Evaluation  

## 2018-01-06 NOTE — Anesthesia Procedure Notes (Signed)
Procedure Name: Intubation Date/Time: 01/06/2018 7:45 AM Performed by: Inda Coke, CRNA Pre-anesthesia Checklist: Patient identified, Emergency Drugs available, Suction available and Patient being monitored Patient Re-evaluated:Patient Re-evaluated prior to induction Oxygen Delivery Method: Circle System Utilized Preoxygenation: Pre-oxygenation with 100% oxygen Induction Type: IV induction Ventilation: Mask ventilation without difficulty Laryngoscope Size: Mac and 3 Grade View: Grade I Tube type: Oral Number of attempts: 1 Airway Equipment and Method: Stylet and Oral airway Placement Confirmation: ETT inserted through vocal cords under direct vision,  positive ETCO2 and breath sounds checked- equal and bilateral Secured at: 21 cm Tube secured with: Tape Dental Injury: Teeth and Oropharynx as per pre-operative assessment  Comments: Performed by Julianne Rice, SRNA

## 2018-01-06 NOTE — Transfer of Care (Signed)
Immediate Anesthesia Transfer of Care Note  Patient: Benjamin Jenkins  Procedure(s) Performed: BILATERAL FEMORAL ARTERY ANEURYSM REPAIR (Bilateral )  Patient Location: PACU  Anesthesia Type:General  Level of Consciousness: awake and alert   Airway & Oxygen Therapy: Patient Spontanous Breathing and Patient connected to nasal cannula oxygen  Post-op Assessment: Report given to RN, Post -op Vital signs reviewed and stable and Patient moving all extremities X 4  Post vital signs: Reviewed and stable  Last Vitals:  Vitals Value Taken Time  BP 147/96 01/06/2018 10:37 AM  Temp    Pulse 101 01/06/2018 10:40 AM  Resp 15 01/06/2018 10:40 AM  SpO2 97 % 01/06/2018 10:40 AM  Vitals shown include unvalidated device data.  Last Pain:  Vitals:   01/06/18 0549  TempSrc:   PainSc: 3       Patients Stated Pain Goal: 2 (01/06/18 0549)  Complications: No apparent anesthesia complications

## 2018-01-06 NOTE — Anesthesia Postprocedure Evaluation (Signed)
Anesthesia Post Note  Patient: Benjamin Jenkins  Procedure(s) Performed: BILATERAL FEMORAL ARTERY ANEURYSM REPAIR (Bilateral )     Patient location during evaluation: PACU Anesthesia Type: General Level of consciousness: awake and alert Pain management: pain level controlled Vital Signs Assessment: post-procedure vital signs reviewed and stable Respiratory status: spontaneous breathing, nonlabored ventilation, respiratory function stable and patient connected to nasal cannula oxygen Cardiovascular status: blood pressure returned to baseline and stable Postop Assessment: no apparent nausea or vomiting Anesthetic complications: no    Last Vitals:  Vitals:   01/06/18 1500 01/06/18 1523  BP:  (!) 146/95  Pulse: 68 67  Resp: 13 14  Temp:  36.6 C  SpO2: 98% 100%    Last Pain:  Vitals:   01/06/18 1530  TempSrc:   PainSc: 2                  Jennah Satchell,Ori COKER

## 2018-01-06 NOTE — Telephone Encounter (Signed)
sch appt lvm 01/31/18 3pm p/o MD

## 2018-01-06 NOTE — Op Note (Addendum)
Patient name: Benjamin Jenkins MRN: [id number] DOB: [birth year] Sex: male  01/06/2018 Pre-operative Diagnosis: bilateral femoral aneurysms Post-operative diagnosis:  Same Surgeon:  Durene Cal  Co-Surgeon:  Lemar Livings Assistants:  Doreatha Massed and Clinton Gallant Procedure:   Open repair of bilateral femoral aneurysms with interposition graft between the bilateral common femoral and superficial femoral arteries, and re-implantation of bilateral profunda femoral arteries  Anesthesia:  General Blood Loss:  400 cc Specimens:  none  Findings:  2-3 mm profunda femoral artery on the left which was re-implanted into the posterior aspect of the interposition 100 dacron graft  Indications: The patient was found to have bilateral greater than 3 cm femoral artery aneurysms.  He comes in today for repair.  He states that he has been having pain in both groins, the left being more severe.  Procedure:  The patient was identified in the holding area and taken to [facility] OR ROOM 16  The patient was then placed supine on the table. general anesthesia was administered.  The patient was prepped and draped in the usual sterile fashion.  A time out was called and antibiotics were administered.  Please see Dr. Darcella Cheshire note for details of the right-sided repair.  On the left, a longitudinal incision was made in the groin.  Using cautery and sharp dissection I dissected out the common femoral artery at the level of the inguinal ligament.  I then dissected out the superficial femoral artery.  Both the common femoral and superficial femoral artery were ectatic.  The common femoral at the level of the inguinal ligament was approximately 11 mm, and the superficial femoral artery was about 10 mm.  I then dissected out the profundofemoral artery.  This appeared to be somewhat small in caliber.  Externally it was about a 4 mm artery.  There were 2 lateral branches, about 1 mm that were also individually isolated.  I then  circumferentially dissected free the femoral aneurysm which measured about 3.5 cm.  Once adequate exposure was obtained, the patient was fully heparinized.  I occluded the superficial femoral and proximal common femoral artery below the epigastric branches with vascular clamps.  The profundofemoral was occluded with a clamp and the branches were occluded with Vesseloops.  The patient was fully heparinized.  A #11 blade was used to make an arteriotomy on the anterior surface of the artery.  This was then extended longitudinally in both directions.  I then transected the proximal superficial femoral artery and distal common femoral artery below the epigastric branches and then removed the aneurysmal tissue.  The profundofemoral artery was transected at its origin.  This had good backbleeding but had a small lumen about 1-2 mm.  I then selected a 10 mm dacryon graft.  I performed a running end to end anastomosis to the superficial femoral artery and proximal common femoral artery.  Once these were completed, the appropriate flushing maneuvers were performed and the clamps were released.  I elected to ligate the branches as they were too small to reimplant.  I elected to reimplant the profundofemoral artery.  I rotated the graft and then reoccluded it so that I had exposure of the posterior side of the graft.  It was opened with a #11 blade and then a 5 mm punch was used.  I then reimplanted the profundofemoral artery to the posterior side of the graft with a running 6-0 Prolene.  Once this was done the clamps were released and blood flow was reestablished.  The patient had brisk Doppler signals in the profunda and superficial femoral artery.  Next, 50 mg of protamine was given.  Once hemostasis was satisfactory the incision was closed by reapproximating the femoral sheath with 2-0 Vicryl.  Subcutaneous tissue was then closed with 3-0 Vicryl followed by 4-0 Vicryl on the skin and Dermabond.   Disposition: To PACU  stable   V. Durene CalWells , M.D. [facility] [place] Office: [phone] Pager:  [phone]

## 2018-01-06 NOTE — Progress Notes (Signed)
Patient arrived to 4E room 25 at this time. Report received from [place], [place]. Patient assessed and V/S stable. Patient oriented to room and how to call nurse with any needs. CCMD notified.   Ernestina ColumbiaK. Starr Donique Hammonds, RN

## 2018-01-06 NOTE — Progress Notes (Signed)
Patient transferred to 4E25, receiving RN in room, patient placed on tele, call bell in reach, friend Mellody DanceKeith called and updated of patients location, no questions from receiving RN or patient.  Hermina BartersBOWMAN, Corrissa Martello M, RN

## 2018-01-07 ENCOUNTER — Encounter (HOSPITAL_COMMUNITY): Payer: Self-pay | Admitting: Surgery

## 2018-01-07 ENCOUNTER — Other Ambulatory Visit: Payer: Self-pay

## 2018-01-07 ENCOUNTER — Encounter (HOSPITAL_COMMUNITY): Payer: 59

## 2018-01-07 DIAGNOSIS — I724 Aneurysm of artery of lower extremity: Secondary | ICD-10-CM

## 2018-01-07 HISTORY — PX: FEMORAL ARTERY ANEURYSM REPAIR: SUR1157

## 2018-01-07 LAB — CBC
HCT: 42.2 % (ref 39.0–52.0)
HEMOGLOBIN: 14.2 g/dL (ref 13.0–17.0)
MCH: 32.9 pg (ref 26.0–34.0)
MCHC: 33.6 g/dL (ref 30.0–36.0)
MCV: 97.7 fL (ref 78.0–100.0)
Platelets: 271 10*3/uL (ref 150–400)
RBC: 4.32 MIL/uL (ref 4.22–5.81)
RDW: 12.7 % (ref 11.5–15.5)
WBC: 15.1 10*3/uL — AB (ref 4.0–10.5)

## 2018-01-07 LAB — BASIC METABOLIC PANEL
ANION GAP: 7 (ref 5–15)
BUN: 8 mg/dL (ref 6–20)
CALCIUM: 8.9 mg/dL (ref 8.9–10.3)
CHLORIDE: 106 mmol/L (ref 101–111)
CO2: 25 mmol/L (ref 22–32)
Creatinine, Ser: 1.02 mg/dL (ref 0.61–1.24)
GFR calc non Af Amer: >60 mL/min (ref >60)
Glucose, Bld: 119 mg/dL — ABNORMAL HIGH (ref 65–99)
Potassium: 3.6 mmol/L (ref 3.5–5.1)
SODIUM: 138 mmol/L (ref 135–145)

## 2018-01-07 MED ORDER — PNEUMOCOCCAL VAC POLYVALENT 25 MCG/0.5ML IJ INJ
0.5000 mL | INJECTION | INTRAMUSCULAR | Status: AC
  Administered 2018-01-08: 0.5 mL via INTRAMUSCULAR
  Filled 2018-01-07: qty 0.5

## 2018-01-07 MED ORDER — OXYCODONE-ACETAMINOPHEN 5-325 MG PO TABS: 1.0000 | ORAL_TABLET | Freq: Four times a day (QID) | ORAL | 0 refills | Status: DC | PRN

## 2018-01-07 NOTE — Progress Notes (Signed)
Patient ambulated to the door of the room x 2 Tolerated well, c/o left thigh and groin pain.

## 2018-01-07 NOTE — Care Management Note (Signed)
Case Management Note  Patient Details  Name: Benjamin Jenkins MRN: [id number] Date of Birth: 1956-02-28  Subjective/Objective:    Bilateral femoral aneurysms                Action/Plan:  NCM spoke to pt and states he will be staying with neighbor, Benjamin Jenkins until he is able to manage at home. Pt requested RW for home. Contacted [facility] for RW for home. Fax[facility] order to [facility] and [facility]. [facility] delivered RW to room.   Expected Discharge Date:                 Expected Discharge Plan:  Home/Self Care  In-House Referral:  NA  Discharge planning Services  CM Consult  Post Acute Care Choice:  NA Choice offered to:  NA  DME Arranged:  [place] rolling DME Agency:  [facility]  HH Arranged:  NA HH Agency:  NA  Status of Service:  Completed, signed off  If discussed at Long Length of Stay Meetings, dates discussed:    Additional Comments:  Elliot CousinShavis, Briseis Aguilera Ellen, RN 01/07/2018, 4:33 PM

## 2018-01-07 NOTE — Progress Notes (Addendum)
  Progress Note    01/07/2018 7:17 AM 1 Day Post-Op  Subjective:  Says today is better than yesterday.    Afebrile HR 60's-90's NSR 120's-170's systolic 97% RA  Vitals:   01/07/18 0400 01/07/18 0435  BP: 138/84 126/78  Pulse: 67 71  Resp: 12 15  Temp:  98.2 F (36.8 C)  SpO2: 97% 98%    Physical Exam: Cardiac:  regular Lungs:  Non labored Incisions:  Bilateral groin incisions are clean and dry Extremities:  Bilateral feet are warm with palpable DP pulses bilaterally (R>L)   CBC    Component Value Date/Time   WBC 12.3 (H) 01/06/2018 1611   RBC 4.19 (L) 01/06/2018 1611   HGB 13.9 01/06/2018 1611   HGB 15.0 02/18/2017 0000   HCT 40.4 01/06/2018 1611   HCT 43.3 02/18/2017 0000   PLT 231 01/06/2018 1611   PLT 294 02/18/2017 0000   MCV 96.4 01/06/2018 1611   MCV 98 (H) 02/18/2017 0000   MCH 33.2 01/06/2018 1611   MCHC 34.4 01/06/2018 1611   RDW 12.5 01/06/2018 1611   RDW 13.6 02/18/2017 0000   LYMPHSABS 1.9 02/18/2017 0000   EOSABS 0.2 02/18/2017 0000   BASOSABS 0.1 02/18/2017 0000    BMET    Component Value Date/Time   NA 140 12/31/2017 1423   NA 140 02/18/2017 0000   K 4.5 12/31/2017 1423   CL 107 12/31/2017 1423   CO2 26 12/31/2017 1423   GLUCOSE 98 12/31/2017 1423   BUN 11 12/31/2017 1423   BUN 16 02/18/2017 0000   CREATININE 0.92 01/06/2018 1611   CALCIUM 9.3 12/31/2017 1423   GFRNONAA >60 01/06/2018 1611   GFRAA >60 01/06/2018 1611    INR    Component Value Date/Time   INR 0.99 12/31/2017 1423     Intake/Output Summary (Last 24 hours) at 01/07/2018 0717 Last data filed at 01/07/2018 0400 Gross per 24 hour  Intake 2752.04 ml  Output 2550 ml  Net 202.04 ml     Assessment:  62 y.o. 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  Will ambulate more today and mobilize and  anticipate dc home tomorrow -discussed with him to take his pain med if needed to allow him to mobilize -DVT prophylaxis:  Sq heparin   Doreatha MassedSamantha Rhyne, PA-C [facility] [phone] 01/07/2018 7:17 AM  I agree with the above.  He is ambulating and voiding.  Tolerating PO. He has palpable pedal pulses bilaterally.  Both groin incisions are soft and dry.  Anticipate d/c home tomorrow as he is still having discomfort and lives alone.  Durene CalWells Brabham

## 2018-01-07 NOTE — Evaluation (Signed)
Physical Therapy Evaluation Patient Details Name: Benjamin Jenkins MRN: [id number] DOB: 08/10/1955 Today's Date: 01/07/2018   History of Present Illness  62 y.o. male  s/p repair B femoral aneyrysms 01/06/18.   PMH includes: Vertigo, COPD, PVD, back surgery, BL knee surgery.     Clinical Impression  Patient is s/p above surgery resulting in functional limitations due to the deficits listed below (see PT Problem List). PTA, pt living alone, working in shipping and receiving for a [employer], independent with all mobility and ADLs. Upon eval pt presents with post op pain and weakness that greatly limits his mobility. Pt min guard level for all motions, ambulating short distances and returning to bed due to pain, ambulating slow with shortened step lengths. VSS on RA, HRmax 89. Patient plans to return to friends home with 24/7 after d/c, no concerns from PT standpoint with that.   Next session will focus on stair training for entry to friends home.  Patient will benefit from skilled PT to increase their independence and safety with mobility to allow discharge to the venue listed below.       Follow Up Recommendations Supervision for mobility/OOB    Equipment Recommendations  Rolling walker with 5" wheels    Recommendations for Other Services       Precautions / Restrictions Precautions Precautions: Fall      Mobility  Bed Mobility Overal bed mobility: Needs Assistance Bed Mobility: Supine to Sit     Supine to sit: Min guard     General bed mobility comments: Min guard due to pain  Transfers Overall transfer level: Needs assistance Equipment used: Rolling walker (2 wheeled) Transfers: Sit to/from Stand Sit to Stand: Min guard         General transfer comment: min guard/supervision level for standing, cues for hand placement on RW. pt with increased time and effort due to pain  Ambulation/Gait Ambulation/Gait assistance: Supervision Gait Distance (Feet): 30  Feet Assistive device: Rolling walker (2 wheeled) Gait Pattern/deviations: Step-to pattern Gait velocity: decreased   General Gait Details: patient ambulating extremly slow and painfully with decreased step lengths. ambulated to door and back in roughly 10 minutes.   Stairs            Wheelchair Mobility    Modified Rankin (Stroke Patients Only)       Balance Overall balance assessment: Mild deficits observed, not formally tested                                           Pertinent Vitals/Pain Pain Assessment: 0-10 Pain Score: 5  Pain Location: Groin Pain Descriptors / Indicators: Operative site guarding;Discomfort;Aching Pain Intervention(s): Limited activity within patient's tolerance;Monitored during session;Premedicated before session;Repositioned    Home Living Family/patient expects to be discharged to:: Private residence Living Arrangements: Alone Available Help at Discharge: Friend(s);Available 24 hours/day Type of Home: House Home Access: Stairs to enter Entrance Stairs-Rails: None Entrance Stairs-Number of Steps: 3 Home Layout: One level Home Equipment: None      Prior Function Level of Independence: Independent         Comments: Working recieving shipments at [employer]     Hand Dominance        Extremity/Trunk Assessment   Upper Extremity Assessment Upper Extremity Assessment: Defer to OT evaluation    Lower Extremity Assessment Lower Extremity Assessment: Overall WFL for tasks assessed  Communication   Communication: No difficulties  Cognition Arousal/Alertness: Awake/alert Behavior During Therapy: WFL for tasks assessed/performed Overall Cognitive Status: Within Functional Limits for tasks assessed                                        General Comments      Exercises     Assessment/Plan    PT Assessment Patient needs continued PT services  PT Problem List Decreased  strength;Decreased range of motion;Decreased activity tolerance;Decreased balance;Decreased mobility;Obesity       PT Treatment Interventions DME instruction;Gait training;Stair training;Functional mobility training;Therapeutic activities;Therapeutic exercise;Balance training    PT Goals (Current goals can be found in the Care Plan section)  Acute Rehab PT Goals Patient Stated Goal: feel better, stay with friends PT Goal Formulation: With patient Time For Goal Achievement: 01/14/18 Potential to Achieve Goals: Good    Frequency Min 3X/week   Barriers to discharge Decreased caregiver support lives alone    Co-evaluation               AM-PAC PT "6 Clicks" Daily Activity  Outcome Measure Difficulty turning over in bed (including adjusting bedclothes, sheets and blankets)?: A Lot Difficulty moving from lying on back to sitting on the side of the bed? : A Lot Difficulty sitting down on and standing up from a chair with arms (e.g., wheelchair, bedside commode, etc,.)?: A Lot Help needed moving to and from a bed to chair (including a wheelchair)?: A Little Help needed walking in hospital room?: A Little Help needed climbing 3-5 steps with a railing? : A Lot 6 Click Score: 14    End of Session Equipment Utilized During Treatment: Gait belt Activity Tolerance: Patient tolerated treatment well Patient left: in bed;with call bell/phone within reach Nurse Communication: Mobility status PT Visit Diagnosis: Unsteadiness on feet (R26.81);Pain;Other abnormalities of gait and mobility (R26.89) Pain - part of body: (abdomen/groin)    Time: 1610-9604 PT Time Calculation (min) (ACUTE ONLY): 39 min   Charges:   PT Evaluation $PT Eval Low Complexity: 1 Low PT Treatments $Gait Training: 8-22 mins   PT G Codes:        Etta Grandchild, PT, DPT Acute Rehab Services Pager: [phone]   Etta Grandchild 01/07/2018, 2:40 PM

## 2018-01-08 LAB — GLUCOSE, CAPILLARY: Glucose-Capillary: 107 mg/dL — ABNORMAL HIGH (ref 65–99)

## 2018-01-08 NOTE — Progress Notes (Addendum)
  Progress Note    01/08/2018 7:26 AM 2 Days Post-Op  Subjective:  C/o soreness.  RN tells me he's not moving much.  Afebrile HR 60's-70's NSR 100's-110's systolic 96% RA  Vitals:   01/08/18 0003 01/08/18 0420  BP: 110/76 105/74  Pulse:  67  Resp:  17  Temp: 98.4 F (36.9 C) 98.1 F (36.7 C)  SpO2:  96%    Physical Exam: Cardiac:  regular Lungs:  Non labored Incisions:  Bilateral groins are clean and dry; no hematoma Extremities:  Easily palpable DP pulses bilaterally   CBC    Component Value Date/Time   WBC 15.1 (H) 01/07/2018 0642   RBC 4.32 01/07/2018 0642   HGB 14.2 01/07/2018 0642   HGB 15.0 02/18/2017 0000   HCT 42.2 01/07/2018 0642   HCT 43.3 02/18/2017 0000   PLT 271 01/07/2018 0642   PLT 294 02/18/2017 0000   MCV 97.7 01/07/2018 0642   MCV 98 (H) 02/18/2017 0000   MCH 32.9 01/07/2018 0642   MCHC 33.6 01/07/2018 0642   RDW 12.7 01/07/2018 0642   RDW 13.6 02/18/2017 0000   LYMPHSABS 1.9 02/18/2017 0000   EOSABS 0.2 02/18/2017 0000   BASOSABS 0.1 02/18/2017 0000    BMET    Component Value Date/Time   NA 138 01/07/2018 0642   NA 140 02/18/2017 0000   K 3.6 01/07/2018 0642   CL 106 01/07/2018 0642   CO2 25 01/07/2018 0642   GLUCOSE 119 (H) 01/07/2018 0642   BUN 8 01/07/2018 0642   BUN 16 02/18/2017 0000   CREATININE 1.02 01/07/2018 0642   CALCIUM 8.9 01/07/2018 0642   GFRNONAA >60 01/07/2018 0642   GFRAA >60 01/07/2018 0642    INR    Component Value Date/Time   INR 0.99 12/31/2017 1423     Intake/Output Summary (Last 24 hours) at 01/08/2018 0726 Last data filed at 01/07/2018 1700 Gross per 24 hour  Intake 1440 ml  Output 1100 ml  Net 340 ml     Assessment:  62 y.o. male is s/p:   Open repair of bilateral femoral aneurysms with interposition graft between the bilateral common femoral and superficial femoral arteries, and re-implantation of bilateral profunda femoral arteries  2 Days Post-Op  Plan: -pt doing well this morning  with easily palpable DP pulses bilaterally -c/o soreness and encouraged him to mobilize more especially when he goes home.  Encouraged him to continue his IS at home also.  Medicated for his bowels this morning-also explained that moving around and walking will also help with this.  -discharge home and f/u with Dr. Myra GianottiBrabham in 2-3 weeks.   Doreatha MassedSamantha Rhyne, PA-C [facility] [phone] 01/08/2018 7:26 AM

## 2018-01-08 NOTE — Progress Notes (Signed)
Ok to discharge patient per Dr Arbie CookeyEarly.

## 2018-01-08 NOTE — Progress Notes (Signed)
Occupational Therapy Treatment Patient Details Name: Benjamin Jenkins MRN: [id number] DOB: 07-Jan-1956 Today's Date: 01/08/2018    History of present illness 62 y.o. male  s/p repair B femoral aneyrysms 01/06/18.   PMH includes: Vertigo, COPD, PVD, back surgery, BL knee surgery.    OT comments  Pt progressing towards established OT goals. Return for second visit to provide education on safe tub transfer before dc home. Pt performing tub transfer with Min Guard A for safety demonstrating understanding of education. Providing handout on tub transfer to increase carry over of education to home. Pt planning for dc today and all acute OT needs met. Will sign off. Thank you.    Follow Up Recommendations  No OT follow up;Supervision/Assistance - 24 hour    Equipment Recommendations  3 in 1 bedside commode    Recommendations for Other Services PT consult    Precautions / Restrictions Precautions Precautions: Fall Restrictions Weight Bearing Restrictions: No       Mobility Bed Mobility               General bed mobility comments: in chair at entry  Transfers Overall transfer level: Needs assistance Equipment used: None Transfers: Sit to/from Stand Sit to Stand: Supervision         General transfer comment: min guard/supervision level for standing, cues for hand placement on RW. pt with increased time and effort due to pain    Balance Overall balance assessment: Mild deficits observed, not formally tested                                         ADL either performed or assessed with clinical judgement   ADL Overall ADL's : Needs assistance/impaired                                 Tub/ Shower Transfer: Tub transfer;Min guard;Ambulation;3 in 1;Rolling walker Tub/Shower Transfer Details (indicate cue type and reason): Educating pt on safe tub transfer techniques with use of RW and 3N1. Pt performing transfer with Min Guard A for safety.  Providing pt with handout on transfer to increase carry over to home.  Functional mobility during ADLs: Supervision/safety;Rolling walker;Min guard General ADL Comments: Pt demonstrating understanding of tub transfer techniques. Continues to move slowly due to pain.     Vision       Perception     Praxis      Cognition Arousal/Alertness: Awake/alert Behavior During Therapy: WFL for tasks assessed/performed Overall Cognitive Status: Within Functional Limits for tasks assessed                                          Exercises     Shoulder Instructions       General Comments VSS    Pertinent Vitals/ Pain       Pain Location: Groin; LLE Pain Descriptors / Indicators: Operative site guarding;Discomfort;Aching  Home Living                                          Prior Functioning/Environment  Frequency  Min 2X/week        Progress Toward Goals  OT Goals(current goals can now be found in the care plan section)  Progress towards OT goals: Progressing toward goals;Goals met/education completed, patient discharged from OT  Acute Rehab OT Goals Patient Stated Goal: feel better, stay with friends OT Goal Formulation: With patient Time For Goal Achievement: 01/22/18 Potential to Achieve Goals: Good ADL Goals Pt Will Perform Lower Body Dressing: with modified independence;sit to/from stand Pt Will Transfer to Toilet: with modified independence;ambulating;bedside commode Pt Will Perform Tub/Shower Transfer: Tub transfer;ambulating;3 in 1;rolling walker;with supervision  Plan Discharge plan remains appropriate;All goals met and education completed, patient discharged from OT services    Co-evaluation                 AM-PAC PT "6 Clicks" Daily Activity     Outcome Measure   Help from another person eating meals?: None Help from another person taking care of personal grooming?: None Help from another person  toileting, which includes using toliet, bedpan, or urinal?: A Little Help from another person bathing (including washing, rinsing, drying)?: A Little Help from another person to put on and taking off regular upper body clothing?: None Help from another person to put on and taking off regular lower body clothing?: A Little 6 Click Score: 21    End of Session Equipment Utilized During Treatment: Gait belt;Rolling walker  OT Visit Diagnosis: Unsteadiness on feet (R26.81);Other abnormalities of gait and mobility (R26.89);Muscle weakness (generalized) (M62.81);Pain Pain - Right/Left: Left Pain - part of body: Leg   Activity Tolerance Patient tolerated treatment well   Patient Left in chair;with call bell/phone within reach   Nurse Communication Mobility status;Other (comment)(Need 3N1)        Time: 2162-4469 OT Time Calculation (min): 19 min  Charges: OT General Charges $OT Visit: 1 Visit OT Treatments $Self Care/Home Management : 8-22 mins  [place], OTR/L Acute Rehab Pager: [phone] Office: [place] 01/08/2018, 4:20 PM

## 2018-01-08 NOTE — Discharge Summary (Signed)
Discharge Summary     Benjamin Jenkins [birth year] 62 y.o. male  [id number]  Admission Date: 01/06/2018  Discharge Date: 01/08/18  Physician: Nada Libman, MD  Admission Diagnosis: BILATERAL FEMORAL ARTERY ANEURYSM   HPI:   This is a 62 y.o. male whopresented to the emergency department on 11/03/2017 for evaluation of abdominal pain which he described as sharp and tearing he underwent a CT scan to evaluate for arterial pathology. 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 He is medically managed for hypercholesterolemia with a statin. He is followed by Dr. Belva Chimes his coronary artery disease.   Hospital Course:  The patient was admitted to the hospital and taken to the operating room on 01/06/2018 and underwent: Open repair of bilateral femoral aneurysms with interposition graft between the bilateral common femoral and superficial femoral arteries, and re-implantation of bilateral profunda femoral arteries  Findings:  2-3 mm profunda femoral artery on the left which was re-implanted into the posterior aspect of the interposition 100 dacron graft  The pt tolerated the procedure well and was transported to the PACU in good condition.   By POD 1, he was doing well.  He had palpable pedal pulses bilaterally.  Both groin incisions soft and dry.    On POD 2, he was doing well with easily palpable DP pulses bilaterally.  He is encouraged to continue mobilization and continue working with the incentive spirometer.  He is discharged home with plans to f/u with Dr. Myra Gianotti in 2-3 weeks.   The remainder of the hospital course consisted of increasing mobilization and increasing intake of solids without difficulty.  CBC    Component Value Date/Time   WBC 15.1 (H) 01/07/2018 0642     RBC 4.32 01/07/2018 0642   HGB 14.2 01/07/2018 0642   HGB 15.0 02/18/2017 0000   HCT 42.2 01/07/2018 0642   HCT 43.3 02/18/2017 0000   PLT 271 01/07/2018 0642   PLT 294 02/18/2017 0000   MCV 97.7 01/07/2018 0642   MCV 98 (H) 02/18/2017 0000   MCH 32.9 01/07/2018 0642   MCHC 33.6 01/07/2018 0642   RDW 12.7 01/07/2018 0642   RDW 13.6 02/18/2017 0000   LYMPHSABS 1.9 02/18/2017 0000   EOSABS 0.2 02/18/2017 0000   BASOSABS 0.1 02/18/2017 0000    BMET    Component Value Date/Time   NA 138 01/07/2018 0642   NA 140 02/18/2017 0000   K 3.6 01/07/2018 0642   CL 106 01/07/2018 0642   CO2 25 01/07/2018 0642   GLUCOSE 119 (H) 01/07/2018 0642   BUN 8 01/07/2018 0642   BUN 16 02/18/2017 0000   CREATININE 1.02 01/07/2018 0642   CALCIUM 8.9 01/07/2018 0642   GFRNONAA >60 01/07/2018 0642   GFRAA >60 01/07/2018 0642       Discharge Diagnosis:  BILATERAL FEMORAL ARTERY ANEURYSM  Secondary Diagnosis: Patient Active Problem List   Diagnosis Date Noted  . 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 CAD (coronary artery disease) 02/17/2017  . COPD with chronic bronchitis and emphysema (HCC) 02/08/2017  . Tobacco abuse 02/08/2017   Past Medical History:  Diagnosis Date  . Anxiety   . Arthritis   . COPD (chronic obstructive pulmonary disease) (HCC)   . Dyspnea   . Headache    hx. migraines,sinus headaches  .  Peripheral vascular disease (HCC)   . Vertigo      Allergies as of 01/08/2018   No Known Allergies     Medication List    TAKE these medications   aspirin 325 MG EC tablet Take 325 mg by mouth daily.   atorvastatin 10 MG tablet Commonly known as:  LIPITOR Take 1 tablet (10 mg total) by mouth daily.   ATROVENT HFA 17 MCG/ACT inhaler Generic drug:  ipratropium TAKE 2 PUFFS BY MOUTH EVERY 6 HOURS AS NEEDED FOR WHEEZE What changed:  See the new instructions.   CoQ10 100 MG Caps Take 100 mg by mouth daily.   Krill Oil 500 MG Caps Take 500 mg by  mouth daily.   multivitamin with minerals Tabs tablet Take 1 tablet by mouth daily.   nitroGLYCERIN 0.4 MG SL tablet Commonly known as:  NITROSTAT Place 1 tablet (0.4 mg total) under the tongue every 5 (five) minutes as needed for chest pain.   oxyCODONE-acetaminophen 5-325 MG tablet Commonly known as:  PERCOCET/ROXICET Take 1 tablet by mouth every 6 (six) hours as needed for moderate pain.   Vitamin D-3 5000 units Tabs Take 5,000 Units by mouth daily.            Durable Medical Equipment  (From admission, onward)        Start     Ordered   01/07/18 1530  For home use only DME Walker rolling  Once    Question:  Patient needs a walker to treat with the following condition  Answer:  Bilateral femoral artery aneurysm [facility])   01/07/18 1533      Discharge Instructions: [facility] Discharge instructions Lower Extremity Bypass Surgery  Please refer to the following instruction for your post-procedure care. Your surgeon or physician assistant will discuss any changes with you.  Activity  You are encouraged to walk as much as you can. You can slowly return to normal activities during the month after your surgery. Avoid strenuous activity and heavy lifting until your doctor tells you it's OK. Avoid activities such as vacuuming or swinging a golf club. Do not drive until your doctor give the OK and you are no longer taking prescription pain medications. It is also normal to have difficulty with sleep habits, eating and bowel movement after surgery. These will go away with time.  Bathing/Showering  You may shower after you go home. Do not soak in a bathtub, hot tub, or swim until the incision heals completely.  Incision Care  Clean your incision with mild soap and water. Shower every day. Pat the area dry with a clean towel. You do not need a bandage unless otherwise instructed. Do not apply any ointments or creams to your incision. If you have  open wounds you will be instructed how to care for them or a visiting nurse may be arranged for you. If you have staples or sutures along your incision they will be removed at your post-op appointment. You may have skin glue on your incision. Do not peel it off. It will come off on its own in about one week.  Wash the groin wound with soap and water daily and pat dry. (No tub bath-only shower)  Then put a dry gauze or washcloth in the groin to keep this area dry to help prevent wound infection.  Do this daily and as needed.  Do not use Vaseline or neosporin on your incisions.  Only use soap and water on  your incisions and then protect and keep dry.  Diet  Resume your normal diet. There are no special food restrictions following this procedure. A low fat/ low cholesterol diet is recommended for all patients with vascular disease. In order to heal from your surgery, it is CRITICAL to get adequate nutrition. Your body requires vitamins, minerals, and protein. Vegetables are the best source of vitamins and minerals. Vegetables also provide the perfect balance of protein. Processed food has little nutritional value, so try to avoid this.  Medications  Resume taking all your medications unless your doctor or Physician Assistant tells you not to. If your incision is causing pain, you may take over-the-counter pain relievers such as acetaminophen (Tylenol). If you were prescribed a stronger pain medication, please aware these medication can cause nausea and constipation. Prevent nausea by taking the medication with a snack or meal. Avoid constipation by drinking plenty of fluids and eating foods with high amount of fiber, such as fruits, vegetables, and grains. Take Colace 100 mg (an over-the-counter stool softener) twice a day as needed for constipation. Do not take Tylenol if you are taking prescription pain medications.  Follow Up  Our office will schedule a follow up appointment 2-3 weeks following  discharge.  Please call us immediately for any of the following conditions  .Severe or worsening pain in your legs or feet while at rest or while walking .Increase pain, redness, warmth, or drainage (pus) from your incision site(s) Fever of 101 degree or higher The swelling in your leg with the bypass suddenly worsens and becomes more painful than when you were in the hospital If you have been instructed to feel your graft pulse then you should do so every day. If you can no longer feel this pulse, call the office immediately. Not all patients are given this instruction.  Leg swelling is common after leg bypass surgery.  The swelling should improve over a few months following surgery. To improve the swelling, you may elevate your legs above the level of your heart while you are sitting or resting. Your surgeon or physician assistant may ask you to apply an ACE wrap or wear compression (TED) stockings to help to reduce swelling.  Reduce your risk of vascular disease  Stop smoking. If you would like help call [facility] at 1-800-QUIT-NOW ((916) 788-4978) or [place] at ([phone])343-8098.  Manage your cholesterol Maintain a desired weight Control your diabetes weight Control your diabetes Keep your blood pressure down  If you have any questions, please call the office at [phone]   Prescriptions given: 1.  Roxicet #15 No Refill  Disposition: home  Patient's condition: is Good  Follow up: 1. Dr. Myra Gianotti in 2 weeks   Doreatha Massed, PA-C [facility] [phone] 01/08/2018  7:31 AM  - For VQI Registry use ---   Post-op:  Wound infection: No  Graft infection: No  Transfusion: No    If yes, n/a units given New Arrhythmia: No Ipsilateral amputation: No, [ ]  Minor, [ ]  BKA, [ ]  AKA Discharge patency: [x ] Primary, [ ]  Primary assisted, [ ]  Secondary, [ ]  Occluded Patency judged by: [ ]  Dopper only, [ ]  Palpable graft pulse, []  Palpable distal pulse, [x ]  ABI inc. > 0.15, [ ]  Duplex Discharge ABI: R not done, L not done D/C Ambulatory Status: Ambulatory with Assistance RW  Complications: MI: No, [ ]  Troponin only, [ ]  EKG or Clinical CHF: No Resp failure:No, [ ]  Pneumonia, [ ]  Ventilator Chg  in renal function: No, [ ]  Inc. Cr > 0.5, [ ]  Temp. Dialysis,  [ ]  Permanent dialysis Stroke: No, [ ]  Minor, [ ]  Major Return to OR: No  Reason for return to OR: [ ]  Bleeding, [ ]  Infection, [ ]  Thrombosis, [ ]  Revision  Discharge medications: Statin use:  yes ASA use:  yes Plavix use:  no Beta blocker use: no CCB use:  No ACEI use:   no ARB use:  no Coumadin use: no

## 2018-01-08 NOTE — Progress Notes (Signed)
Physical Therapy Treatment Patient Details Name: Benjamin Jenkins MRN: [id number] DOB: 11-11-1955 Today's Date: 01/08/2018    History of Present Illness 62 y.o. male  s/p repair B femoral aneyrysms 01/06/18.   PMH includes: Vertigo, COPD, PVD, back surgery, BL knee surgery.     PT Comments    Patient progressing with therapy, ambulating with more fluidity yet still decreased speed and painful.  Session focused on stair training for safe entry to friends home upon discharge. Cued patient for safe sequencing given L pain greater than R, pt with 1 LOB recurring min A to stabilize prior to demonstrating consistently with sequencing, afterwards able to perform stairs with supervision level. Advised to ensure safety to have friends guarding, discussed proper body mechanics pt verbalizes understanding, denies concerns for return home today. No concerns from PT standpoint.     Follow Up Recommendations  Supervision for mobility/OOB     Equipment Recommendations  Rolling walker with 5" wheels    Recommendations for Other Services       Precautions / Restrictions Precautions Precautions: Fall Restrictions Weight Bearing Restrictions: No    Mobility  Bed Mobility Overal bed mobility: Needs Assistance Bed Mobility: Supine to Sit     Supine to sit: Supervision     General bed mobility comments: in chair at entry  Transfers Overall transfer level: Needs assistance Equipment used: None Transfers: Sit to/from Stand Sit to Stand: Supervision         General transfer comment: min guard/supervision level for standing, cues for hand placement on RW. pt with increased time and effort due to pain  Ambulation/Gait Ambulation/Gait assistance: Supervision Gait Distance (Feet): 30 Feet Assistive device: None Gait Pattern/deviations: Step-to pattern Gait velocity: decreased   General Gait Details: ambulating with min guard no AD, supervisoin with RW.    Stairs Stairs: Yes Stairs  assistance: Min assist;Min guard;Supervision Stair Management: No rails;Step to pattern;Forwards Number of Stairs: 12 General stair comments: trialed stairs with and without railing, cues for sequencing as pt has L sided pain greater than R sided pain. pt ambulating stairs with min guard for safety, intermittent supervision, had 1 LOB requring min A when patient used wrong leg first going down. pt safe with friends assistance discussed to not enter steps alone at this time. pt agreeable.  pt demonstrating understanding of up with strong leg, down with weaker leg.    Wheelchair Mobility    Modified Rankin (Stroke Patients Only)       Balance Overall balance assessment: Mild deficits observed, not formally tested                                          Cognition Arousal/Alertness: Awake/alert Behavior During Therapy: WFL for tasks assessed/performed Overall Cognitive Status: Within Functional Limits for tasks assessed                                        Exercises      General Comments General comments (skin integrity, edema, etc.): VSS      Pertinent Vitals/Pain Pain Assessment: 0-10 Pain Score: 4  Pain Location: Groin; LLE Pain Descriptors / Indicators: Operative site guarding;Discomfort;Aching Pain Intervention(s): Limited activity within patient's tolerance;Monitored during session;Repositioned;Premedicated before session    Home Living Family/patient expects to be discharged to:: Private residence Living  Arrangements: Alone Available Help at Discharge: Friend(s);Available 24 hours/day Type of Home: House Home Access: Stairs to enter Entrance Stairs-Rails: None Home Layout: One level Home Equipment: None Additional Comments: Pt planning on dc to friend's home    Prior Function Level of Independence: Independent      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   PT Goals (current goals can now be found in  the care plan section) Acute Rehab PT Goals Patient Stated Goal: feel better, stay with friends PT Goal Formulation: With patient Time For Goal Achievement: 01/14/18 Potential to Achieve Goals: Good Progress towards PT goals: Progressing toward goals    Frequency    Min 3X/week      PT Plan Current plan remains appropriate    Co-evaluation              AM-PAC PT "6 Clicks" Daily Activity  Outcome Measure  Difficulty turning over in bed (including adjusting bedclothes, sheets and blankets)?: A Lot Difficulty moving from lying on back to sitting on the side of the bed? : A Lot Difficulty sitting down on and standing up from a chair with arms (e.g., wheelchair, bedside commode, etc,.)?: A Lot Help needed moving to and from a bed to chair (including a wheelchair)?: A Little Help needed walking in hospital room?: A Little Help needed climbing 3-5 steps with a railing? : A Lot 6 Click Score: 14    End of Session Equipment Utilized During Treatment: Gait belt Activity Tolerance: Patient tolerated treatment well Patient left: with call bell/phone within reach;in chair Nurse Communication: Mobility status PT Visit Diagnosis: Unsteadiness on feet (R26.81);Pain;Other abnormalities of gait and mobility (R26.89) Pain - part of body: (BLE groin)     Time: [id number] PT Time Calculation (min) (ACUTE ONLY): 15 min  Charges:  $Gait Training: 8-22 mins                    G Codes:       Etta GrandchildSean Lynnett Langlinais, PT, DPT Acute Rehab Services Pager: [phone]     Etta GrandchildSean Grae Cannata 01/08/2018, 10:01 AM

## 2018-01-08 NOTE — Evaluation (Signed)
Occupational Therapy Evaluation Patient Details Name: Benjamin BlossomDavid K Galant MRN: [id number] DOB: 11-17-1955 Today's Date: 01/08/2018    History of Present Illness 62 y.o. male  s/p repair B femoral aneyrysms 01/06/18.   PMH includes: Vertigo, COPD, PVD, back surgery, BL knee surgery.    Clinical Impression   PTA, pt was living alone and was independent and working. Pt planning on dc to friends house who can provide close to 24/7 support. Pt currently requiring Min Guard A-Min A for LB ADLs and supervision-Min Guard A for toileting and functional mobility with RW. Educating pt on LB ADL compensatory techniques and pt demonstrating understanding. Pt reporting increase pain and needing rest break after toileting. Pt planning for possible dc today. Will return for education on safe tub transfers with 3N1. Recommend dc home once medically stable per physician with initial 24/7 support.     Follow Up Recommendations  No OT follow up;Supervision/Assistance - 24 hour    Equipment Recommendations  3 in 1 bedside commode    Recommendations for Other Services PT consult     Precautions / Restrictions Precautions Precautions: Fall Restrictions Weight Bearing Restrictions: No      Mobility Bed Mobility Overal bed mobility: Needs Assistance Bed Mobility: Supine to Sit     Supine to sit: Supervision     General bed mobility comments: Positioned bed to simulate home set up  Transfers Overall transfer level: Needs assistance Equipment used: Rolling walker (2 wheeled) Transfers: Sit to/from Stand Sit to Stand: Min guard         General transfer comment: min guard/supervision level for standing, cues for hand placement on RW. pt with increased time and effort due to pain    Balance Overall balance assessment: Mild deficits observed, not formally tested                                         ADL either performed or assessed with clinical judgement   ADL Overall ADL's  : Needs assistance/impaired Eating/Feeding: Set up;Sitting   Grooming: Wash/dry hands;Set up;Supervision/safety;Standing;Wash/dry face Grooming Details (indicate cue type and reason): Hand hygiene and grooming with supervision for safety Upper Body Bathing: Set up;Supervision/ safety;Sitting   Lower Body Bathing: Min guard;Sit to/from stand   Upper Body Dressing : Set up;Supervision/safety;Sitting   Lower Body Dressing: Minimal assistance;Sit to/from stand Lower Body Dressing Details (indicate cue type and reason): Min A for slight posterior lean during LB dressing. Educating pt on compensatory techniques to decreased pain and increase independence and safety Toilet Transfer: Supervision/safety;BSC;Ambulation;RW   Toileting- ArchitectClothing Manipulation and Hygiene: Supervision/safety;Sit to/from stand     Tub/Shower Transfer Details (indicate cue type and reason): Will return for education on tub transfer Functional mobility during ADLs: Supervision/safety;Rolling walker;Min guard General ADL Comments: Pt moving slowly and requiring increased time for ADLs and funcitonal mobility     Vision         Perception     Praxis      Pertinent Vitals/Pain Pain Assessment: 0-10 Pain Score: 4  Pain Location: Groin; LLE Pain Descriptors / Indicators: Operative site guarding;Discomfort;Aching Pain Intervention(s): Monitored during session;Limited activity within patient's tolerance;Repositioned;Premedicated before session     Hand Dominance Right   Extremity/Trunk Assessment Upper Extremity Assessment Upper Extremity Assessment: Overall WFL for tasks assessed   Lower Extremity Assessment Lower Extremity Assessment: Defer to PT evaluation   Cervical / Trunk Assessment Cervical / Trunk  Assessment: Normal   Communication Communication Communication: No difficulties   Cognition Arousal/Alertness: Awake/alert Behavior During Therapy: WFL for tasks assessed/performed Overall Cognitive  Status: Within Functional Limits for tasks assessed                                     General Comments  VSS. Mainly limited by pain    Exercises     Shoulder Instructions      Home Living Family/patient expects to be discharged to:: Private residence Living Arrangements: Alone Available Help at Discharge: Friend(s);Available 24 hours/day Type of Home: House Home Access: Stairs to enter [employer] of Steps: 3 Entrance Stairs-Rails: None Home Layout: One level     Bathroom Shower/Tub: Chief Strategy Officer: Standard Bathroom Accessibility: Yes   Home Equipment: None   Additional Comments: Pt planning on dc to friend's home      Prior Functioning/Environment Level of Independence: Independent        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        OT Problem List: Decreased strength;Decreased range of motion;Decreased activity tolerance;Impaired balance (sitting and/or standing);Decreased knowledge of use of DME or AE;Decreased knowledge of precautions;Pain      OT Treatment/Interventions: Self-care/ADL training;Therapeutic exercise;Energy conservation;DME and/or AE instruction;Therapeutic activities;Patient/family education    OT Goals(Current goals can be found in the care plan section) Acute Rehab OT Goals Patient Stated Goal: feel better, stay with friends OT Goal Formulation: With patient Time For Goal Achievement: 01/22/18 Potential to Achieve Goals: Good ADL Goals Pt Will Perform Lower Body Dressing: with modified independence;sit to/from stand Pt Will Transfer to Toilet: with modified independence;ambulating;bedside commode Pt Will Perform Tub/Shower Transfer: Tub transfer;ambulating;3 in 1;rolling walker;with supervision  OT Frequency: Min 2X/week   Barriers to D/C:            Co-evaluation              AM-PAC PT "6 Clicks" Daily Activity     Outcome Measure Help from another  person eating meals?: None Help from another person taking care of personal grooming?: None Help from another person toileting, which includes using toliet, bedpan, or urinal?: A Little Help from another person bathing (including washing, rinsing, drying)?: A Little Help from another person to put on and taking off regular upper body clothing?: None Help from another person to put on and taking off regular lower body clothing?: A Little 6 Click Score: 21   End of Session Equipment Utilized During Treatment: Gait belt;Rolling walker Nurse Communication: Mobility status  Activity Tolerance: Patient tolerated treatment well Patient left: in chair;with call bell/phone within reach  OT Visit Diagnosis: Unsteadiness on feet (R26.81);Other abnormalities of gait and mobility (R26.89);Muscle weakness (generalized) (M62.81);Pain Pain - Right/Left: Left Pain - part of body: Leg                Time: 5366-4403 OT Time Calculation (min): 28 min Charges:  OT General Charges $OT Visit: 1 Visit OT Evaluation $OT Eval Moderate Complexity: 1 Mod OT Treatments $Self Care/Home Management : 8-22 mins G-Codes:     Blayre Papania MSOT, OTR/L Acute Rehab Pager: [phone] Office: [phone]  Theodoro Grist Jaryiah Mehlman 01/08/2018, 9:09 AM

## 2018-01-08 NOTE — Progress Notes (Signed)
Patient received 3 in 1 and all belongings in possession. Taking via wheelchair to family vehicle in stable condition and denies questions regarding discharge instructions or medications.

## 2018-01-08 NOTE — Plan of Care (Signed)

## 2018-01-08 NOTE — Progress Notes (Signed)
Discharge instruction given to patient with teach back method. All questions answered. Piv's removed. Patient is awaiting ride at this time.

## 2018-01-31 ENCOUNTER — Ambulatory Visit (INDEPENDENT_AMBULATORY_CARE_PROVIDER_SITE_OTHER): Payer: Self-pay | Admitting: Surgery

## 2018-01-31 ENCOUNTER — Encounter: Payer: Self-pay | Admitting: Surgery

## 2018-01-31 ENCOUNTER — Other Ambulatory Visit: Payer: Self-pay

## 2018-01-31 VITALS — BP 133/88 | HR 104 | Temp 99.5°F | Resp 16 | Ht 69.0 in | Wt 135.0 lb

## 2018-01-31 DIAGNOSIS — I724 Aneurysm of artery of lower extremity: Secondary | ICD-10-CM

## 2018-01-31 NOTE — Progress Notes (Signed)
POST OPERATIVE OFFICE NOTE    CC:  F/u for surgery  HPI:  This is a 62 y.o. male who is s/p Open repair of bilateral femoral aneurysms with interposition graft between the bilateral common femoral and superficial femoral arteries, and re-implantation of bilateral profunda femoral arteries on 01/06/18 by Dr. Myra Gianotti.  He states that when he stretches at night, he will get a cramp in his legs that last less than 30 seconds.  He states that his walking is better.  He says that he has some swelling in his feet, but the swelling in his legs has gotten better.  He says he has a lump in the right groin that is tender.  He has quit smoking.  He takes asa/statin daily.  He states he has had some anxieties, which he has seen his PCP for and received medication for.     No Known Allergies  Current Outpatient Medications  Medication Sig Dispense Refill  . aspirin 325 MG EC tablet Take 325 mg by mouth daily.    Marland Kitchen atorvastatin (LIPITOR) 10 MG tablet Take 1 tablet (10 mg total) by mouth daily. 30 tablet 11  . ATROVENT HFA 17 MCG/ACT inhaler TAKE 2 PUFFS BY MOUTH EVERY 6 HOURS AS NEEDED FOR WHEEZE (Patient taking differently: Inhale 2 puffs into the lungs in the morning) 12.9 Inhaler 0  . Cholecalciferol (VITAMIN D-3) 5000 units TABS Take 5,000 Units by mouth daily.    . Coenzyme Q10 (COQ10) 100 MG CAPS Take 100 mg by mouth daily.    [name] 500 MG CAPS Take 500 mg by mouth daily.    . Multiple Vitamin (MULTIVITAMIN WITH MINERALS) TABS tablet Take 1 tablet by mouth daily.    . nitroGLYCERIN (NITROSTAT) 0.4 MG SL tablet Place 1 tablet (0.4 mg total) under the tongue every 5 (five) minutes as needed for chest pain. 25 tablet 11   No current facility-administered medications for this visit.      ROS:  See HPI  Physical Exam:  Vitals:   01/31/18 1445  BP: 133/88  Pulse: (!) 104  Resp: 16  Temp: 99.5 F (37.5 C)  SpO2: 97%    Incision:  Bilateral groin incisions are healing nicely; there is a  seroma vs hematoma on the right medial to incision.  There is erythema.  Tender to touch.   Extremities:  Triphasic doppler signal right DP and monophasic right PT and left DP/PT; bilateral feet are warm.  +pitting edema bilateral feet with right > left.   Assessment/Plan:  This is a 62 y.o. male who is s/p: Open repair of bilateral femoral aneurysms with interposition graft between the bilateral common femoral and superficial femoral arteries, and re-implantation of bilateral profunda femoral arteries     -pt with good doppler signals bilateral DP/PT; he does have some pitting edema in his feet.  He will continue to elevate his legs when he is not walking.  The swelling in his legs has improved.  Dr. Myra Gianotti recommended compression socks.  Information for elastic therapy was given to pt.   -seroma vs right groin-continue to monitor-body will resorb this fluid.  Discussed with pt that if this worsens or he has problems to call us to be seen. -he has quit smoking -he may return to work 6 weeks from surgery. -he will f/u in 6 months with AAA duplex and duplex of bilateral groins and ABI's -he will call sooner if he has any issues.    Doreatha Massed, PA-C  [facility] [phone]  Clinic MD:  Pt seen and examined with Dr. Myra GianottiBrabham

## 2018-02-22 ENCOUNTER — Telehealth: Payer: Self-pay

## 2018-02-22 NOTE — Telephone Encounter (Signed)
Returned patient call. Having some occasional shooting pains in operative leg and some areas of numbness. Feet normal in temp and color. No other issues. Assured patient this can be normal following surgery. Call back if worsens or continues over a longer period of time.

## 2018-03-14 ENCOUNTER — Encounter: Payer: Self-pay | Admitting: Surgery

## 2018-03-23 ENCOUNTER — Telehealth: Payer: Self-pay | Admitting: *Deleted

## 2018-03-23 NOTE — Telephone Encounter (Signed)
Patient called and c/o continuing and worsening bilateral leg pain. Unable to walk long distance or work. Painful to touch. No acute color or temperature change. Sched. To see NP on 03/28/18.

## 2018-03-28 ENCOUNTER — Ambulatory Visit (INDEPENDENT_AMBULATORY_CARE_PROVIDER_SITE_OTHER): Payer: Self-pay | Admitting: Family

## 2018-03-28 ENCOUNTER — Other Ambulatory Visit: Payer: Self-pay

## 2018-03-28 ENCOUNTER — Encounter: Payer: Self-pay | Admitting: Family

## 2018-03-28 VITALS — BP 126/78 | HR 96 | Resp 20 | Ht 69.0 in | Wt 135.7 lb

## 2018-03-28 DIAGNOSIS — R0989 Other specified symptoms and signs involving the circulatory and respiratory systems: Secondary | ICD-10-CM

## 2018-03-28 DIAGNOSIS — I724 Aneurysm of artery of lower extremity: Secondary | ICD-10-CM

## 2018-03-28 DIAGNOSIS — Z87891 Personal history of nicotine dependence: Secondary | ICD-10-CM

## 2018-03-28 MED ORDER — GABAPENTIN 300 MG PO CAPS: 300.0000 mg | ORAL_CAPSULE | Freq: Every day | ORAL | 6 refills | Status: DC

## 2018-03-28 NOTE — Progress Notes (Signed)
[facility] [place]   CC: pain in bilateral groins and left thigh  History of Present Illness Benjamin Jenkins is a 62 y.o. male who is s/p open repair of bilateral femoral aneurysms with interposition graft between the bilateral commonfemoral and superficial femoral arteries,and re-implantation of bilateral profunda femoral arterieson 01/06/18 by Dr. Myra Gianotti.  He states he has had some anxieties, which he has seen his PCP for and received medication for.      Pt was last evaluated on 01-31-18 by S. Rhyne PA-C. At that time pt with good doppler signals bilateral DP/PT; he had some pitting edema in his feet.  He was to continue to elevate his legs when he is not walking.  The swelling in his legs had improved.  Dr. Myra Gianotti recommended compression socks.  Information for elastic therapy was given to pt.   -seroma vs right groin-continue to monitor-body will resorb this fluid.  Discussed with pt that if this worsens or he has problems to call us to be seen. -he has quit smoking -he may return to work 6 weeks from surgery. -he will f/u in 6 months with AAA duplex and duplex of bilateral groins and ABI's -he will call sooner if he has any issues.   He returns today with c/o bilateral groin pain, left worse than right, since the 01-15-18 surgery, at times, if he moves too fast. He has had lumbar spine surgery, states this pain does not seem related to this. The pain originates in both groins and does not radiate. He also reports chronic numbness at the medial aspect of his left thigh since the June 2019 surgery. He states he needs to get back to work to pay his bills. 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  He wants to get back to work in 7 days, working half days for about a week.    He does not have claudication symptoms in his legs with walking. He has pain on both anterior thighs if he rests his hands on his thighs, just since the  June 2019 surgery.   He is scheduled to follow up on 08-15-18 with Dr. Myra Gianotti and AAA duplex, duplex of bilateral groins, and ABI's.    Diabetic: No Tobacco use: former smoker, quit 11-07-17, smoked x 20 years   Pt meds include: Statin :Yes Betablocker: No ASA: Yes Other anticoagulants/antiplatelets: no  Past Medical History:  Diagnosis Date  . Anxiety   . Arthritis   . COPD (chronic obstructive pulmonary disease) (HCC)   . Dyspnea   . Headache    hx. migraines,sinus headaches  . Peripheral vascular disease (HCC)   . Vertigo     Social History Social History   Tobacco Use  . Smoking status: Former Smoker    Packs/day: 0.50    Years: 20.00    Pack years: 10.00    Types: Cigarettes    Last attempt to quit: 11/07/2017    Years since quitting: 0.3  . Smokeless tobacco: Former Neurosurgeon    Quit date: 07/21/1988  . Tobacco comment: has stopped in last couple weeks  Substance Use Topics  . Alcohol use: Yes    Alcohol/week: 1.0 - 3.0 standard drinks    Types: 1 - 3 Cans of beer per week    Comment: nightly  . Drug use: Yes    Types: Marijuana    Comment: none in several months    Family History Family History  Problem Relation Age of Onset  . Heart disease Mother     Past Surgical History:  Procedure Laterality Date  . arm surgery Right in teens   torn muscle  . arthroscopic knee Bilateral 1990's  . BACK SURGERY    . FEMORAL ARTERY ANEURYSM REPAIR Bilateral 01/07/2018  . FEMORAL ARTERY EXPLORATION Bilateral 01/06/2018   Procedure: BILATERAL FEMORAL ARTERY ANEURYSM REPAIR;  Surgeon: Nada Libman, MD;  Location: [facility] OR;  Service: Vascular;  Laterality: Bilateral;  . LEFT HEART CATH AND CORONARY ANGIOGRAPHY N/A 02/25/2017   Procedure: LEFT HEART CATH AND CORONARY ANGIOGRAPHY;  Surgeon: Lennette Bihari, MD;  Location: [facility] INVASIVE [facility];  Service: Cardiovascular;  Laterality: N/A;    No Known Allergies  Current Outpatient Medications  Medication Sig Dispense Refill   . aspirin 325 MG EC tablet Take 325 mg by mouth daily.    Marland Kitchen atorvastatin (LIPITOR) 10 MG tablet Take 1 tablet (10 mg total) by mouth daily. 30 tablet 11  . ATROVENT HFA 17 MCG/ACT inhaler TAKE 2 PUFFS BY MOUTH EVERY 6 HOURS AS NEEDED FOR WHEEZE (Patient taking differently: Inhale 2 puffs into the lungs in the morning) 12.9 Inhaler 0  . Cholecalciferol (VITAMIN D-3) 5000 units TABS Take 5,000 Units by mouth daily.    . Coenzyme Q10 (COQ10) 100 MG CAPS Take 100 mg by mouth daily.    [name] 500 MG CAPS Take 500 mg by mouth daily.    . Multiple Vitamin (MULTIVITAMIN WITH MINERALS) TABS tablet Take 1 tablet by mouth daily.    . nitroGLYCERIN (NITROSTAT) 0.4 MG SL tablet Place 1 tablet (0.4 mg total) under the tongue every 5 (five) minutes as needed for chest pain. 25 tablet 11   No current facility-administered medications for this visit.     ROS: See HPI for pertinent positives and negatives.   Physical Examination  Vitals:   03/28/18 1439 03/28/18 1445  BP: (!) 143/89 126/78  Pulse: 96   Resp: 20   SpO2: 96%   Weight: 135 lb 11.2 oz (61.6 kg)   Height: 5\' 9"  (1.753 m)    Body mass index is 20.04 kg/m.  General: A&O x 3, WDWN, male. Gait: normal HENT: No gross abnormalities.  Eyes: PERRLA. Pulmonary: Respirations are non labored, CTAB, good air movement in all fields Cardiac: regular rhythm, no detected murmur.         Carotid Bruits Right Left   Negative Negative   Radial pulses are 2+ palpable bilaterally   Adominal aortic pulse is not palpable                         VASCULAR EXAM: Extremities without ischemic changes, without Gangrene; without open wounds.                                                                                                          LE Pulses Right Left       FEMORAL  2-3+ palpable  2-3+ palpable  POPLITEAL  4+ palpable   3-4+ palpable       POSTERIOR TIBIAL  2+ palpable   2+ palpable        DORSALIS PEDIS      ANTERIOR  TIBIAL 1+ palpable  1+ palpable    Abdomen: soft, NT, no palpable masses. Pain in groins with palpation of femoral pulses.  Skin: no rashes, no cellulitis, no ulcers noted. Musculoskeletal: no muscle wasting or atrophy.  Neurologic: A&O X 3; appropriate affect, Sensation is normal; MOTOR FUNCTION:  moving all extremities equally, motor strength 5/5 throughout. Speech is fluent/normal. CN 2-12 intact. Psychiatric: Thought content is normal, mood is somewhat anxious, speech is loquacious.     ASSESSMENT: CEDERICK BROADNAX is a 62 y.o. male who is s/p open repair of bilateral femoral aneurysms with interposition graft between the bilateral commonfemoral and superficial femoral arteries,and re-implantation of bilateral profunda femoral arterieson 01/06/18 by Dr. Myra Gianotti.  I discussed with Dr. Myra Gianotti pt HPI, 4+ bilateral popliteal pulses. Will schedule him to return in 3 months with AAA duplex, bilateral LE arterial duplex, and ABI's. And add gabapentin to help with pain in groins and hyperesthesia in thighs.   Pt states he needs to return to work on 04-04-18 for financial reasons, states he feels he can work half days for a week, then full time; work note to be given to return to work.   DATA None recently   PLAN:  Based on the patient's vascular studies and examination, pt will return to clinic in 3 months with AAA duplex, bilateral LE arterial duplex, and ABI's.  Gabapentin 300 mg, one cap po qhs for neuropathic pain. Pt will call and let me know if he needs to take more than once qhs.  Disp#30, 6 refills.   I discussed in depth with the patient the nature of atherosclerosis, and emphasized the importance of maximal medical management including strict control of blood pressure, blood glucose, and lipid levels, obtaining regular exercise, and continued cessation of smoking.  The patient is aware that without maximal medical management the underlying atherosclerotic disease process will  progress, limiting the benefit of any interventions.  The patient was given information about PAD including signs, symptoms, treatment, what symptoms should prompt the patient to seek immediate medical care, and risk reduction measures to take.  Charisse March, RN, MSN, FNP-C [facility] [place] Phone: [phone]  Clinic MD: Myra Gianotti  03/28/18 2:53 PM

## 2018-03-30 ENCOUNTER — Other Ambulatory Visit: Payer: Self-pay

## 2018-03-30 DIAGNOSIS — I714 Abdominal aortic aneurysm, without rupture, unspecified: Secondary | ICD-10-CM

## 2018-03-30 DIAGNOSIS — R0989 Other specified symptoms and signs involving the circulatory and respiratory systems: Secondary | ICD-10-CM

## 2018-03-30 DIAGNOSIS — I724 Aneurysm of artery of lower extremity: Secondary | ICD-10-CM

## 2018-04-22 ENCOUNTER — Other Ambulatory Visit: Payer: Self-pay | Admitting: Family

## 2018-04-28 IMAGING — NM NM MISC PROCEDURE
8 series · 48 of 48 positions shown · non-contrast
Comparison: none

[Series 1: rest · 6.51mm/px · 6 of 64 frames shown (1 of 2)]
[frame 6/64]
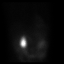
[frame 16/64]
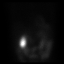
[frame 27/64]
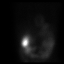
[frame 38/64]
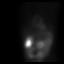
[frame 48/64]
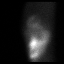
[frame 59/64]
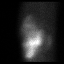

[Series 1: wbr_r-proj_st rest · 6.51mm/px · 6 of 64 frames shown (1 of 2)]
[frame 6/64]
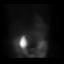
[frame 16/64]
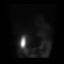
[frame 27/64]
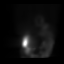
[frame 38/64]
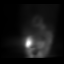
[frame 48/64]
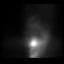
[frame 59/64]
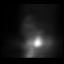

[Series 2: rest · 6.51mm/px · 6 of 64 frames shown (2 of 2)]
[frame 6/64]
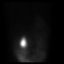
[frame 16/64]
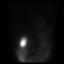
[frame 27/64]
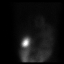
[frame 38/64]
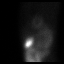
[frame 48/64]
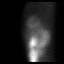
[frame 59/64]
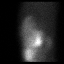

[Series 2: wbr_r-proj_st rest · 6.51mm/px · 6 of 64 frames shown (2 of 2)]
[frame 6/64]
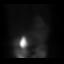
[frame 16/64]
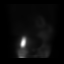
[frame 27/64]
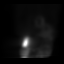
[frame 38/64]
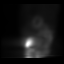
[frame 48/64]
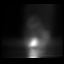
[frame 59/64]
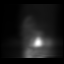

[Series 3: wbr_s-proj_st stress · 6.51mm/px · 6 of 64 frames shown (1 of 2)]
[frame 6/64]
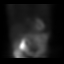
[frame 16/64]
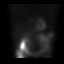
[frame 27/64]
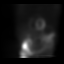
[frame 38/64]
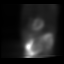
[frame 48/64]
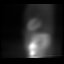
[frame 59/64]
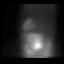

[Series 3: stress · 6.51mm/px · 6 of 512 frames shown (1 of 2)]
[frame 43/512]
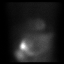
[frame 128/512]
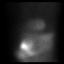
[frame 214/512]
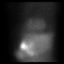
[frame 299/512]
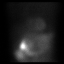
[frame 384/512]
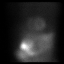
[frame 470/512]
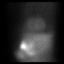

[Series 3: wbr_s-proj_st stress · 6.51mm/px · 6 of 512 frames shown (2 of 2)]
[frame 43/512]
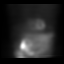
[frame 128/512]
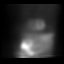
[frame 214/512]
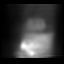
[frame 299/512]
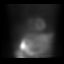
[frame 384/512]
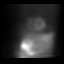
[frame 470/512]
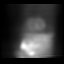

[Series 3: stress · 6.51mm/px · 6 of 64 frames shown (2 of 2)]
[frame 6/64]
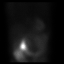
[frame 16/64]
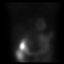
[frame 27/64]
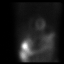
[frame 38/64]
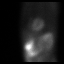
[frame 48/64]
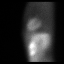
[frame 59/64]
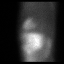

[48 of 48 positions shown; findings below may reference images not displayed]

Canned report from images found in remote index.

Refer to host system for actual result text.

## 2018-06-21 ENCOUNTER — Other Ambulatory Visit: Payer: Self-pay

## 2018-06-21 ENCOUNTER — Ambulatory Visit (INDEPENDENT_AMBULATORY_CARE_PROVIDER_SITE_OTHER): Payer: 59 | Admitting: Family Medicine

## 2018-06-21 ENCOUNTER — Encounter: Payer: Self-pay | Admitting: Family Medicine

## 2018-06-21 VITALS — BP 137/83 | HR 95 | Temp 99.4°F | Resp 18 | Ht 69.0 in | Wt 138.0 lb

## 2018-06-21 DIAGNOSIS — J441 Chronic obstructive pulmonary disease with (acute) exacerbation: Secondary | ICD-10-CM | POA: Diagnosis not present

## 2018-06-21 DIAGNOSIS — R05 Cough: Secondary | ICD-10-CM | POA: Diagnosis not present

## 2018-06-21 DIAGNOSIS — R059 Cough, unspecified: Secondary | ICD-10-CM

## 2018-06-21 DIAGNOSIS — J069 Acute upper respiratory infection, unspecified: Secondary | ICD-10-CM | POA: Diagnosis not present

## 2018-06-21 MED ORDER — GUAIFENESIN-CODEINE 100-10 MG/5ML PO SYRP: 5.0000 mL | ORAL_SOLUTION | Freq: Three times a day (TID) | ORAL | 0 refills | Status: DC | PRN

## 2018-06-21 MED ORDER — DOXYCYCLINE HYCLATE 100 MG PO TABS
100.0000 mg | ORAL_TABLET | Freq: Two times a day (BID) | ORAL | 0 refills | Status: DC
Start: 2018-06-21 — End: 2018-09-12

## 2018-06-21 NOTE — Progress Notes (Signed)
Patient ID: Benjamin Jenkins, male    DOB: December 30, 1955  Age: 62 y.o. MRN: [id number]  Chief Complaint  Patient presents with  . Cough    URI productive cough x1week   . Chills    Subjective:   Patient has been feeling a cold coming on for over a week, but the last few days he has been getting steadily worse with upper respiratory infection, nasal drainage, and cough.  The cough is productive does have a history of COPD from previous smoking.  No longer is a smoker.  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  Objective:  BP 137/83   Pulse 95   Temp 99.4 F (37.4 C) (Oral)   Resp 18   Ht 5\' 9"  (1.753 m)   Wt 138 lb (62.6 kg)   SpO2 95%   BMI 20.38 kg/m  Constant, alert and oriented.  TMs normal.  Throat not erythematous.  Uvula color.  His neck is supple without significant nodes.  Chest has a few rhonchi.  Mild delayed expiration from his COPD.  Heart regular.  Assessment & Plan:   Assessment: 1. Cough   2. Acute upper respiratory infection   3. COPD exacerbation (HCC)       Plan: See instructions.  Call in several days for your  No orders of the defined types were placed in this encounter.   No orders of the defined types were placed in this encounter.        Patient Instructions   Drink lots of fluids and get enough rest.  Take doxycycline 100 mg 1 twice daily for infection  Take benzonatate 1 or 2 pills 3 times daily as needed for daytime cough  Take guaifenesin with codeine cough syrup 1 teaspoon every  6 hours as needed for cough.  This is sedating and is best taken at nighttime.  You can take additional over-the-counter Mucinex if necessary to help thin the secretions.  Return if worse  If you have lab work done today you will be contacted with your lab results within the next 2 weeks.  If you have not heard from us then please contact us. The fastest way to get your  results is to register for My Chart.   IF you received an x-ray today, you will receive an invoice from [facility]. Please contact [facility] at [phone] with questions or concerns regarding your invoice.   IF you received labwork today, you will receive an invoice from [facility]. Please contact [facility] at [phone] with questions or concerns regarding your invoice.   Our billing staff will not be able to assist you with questions regarding bills from these companies.  You will be contacted with the lab results as soon as they are available. The fastest way to get your results is to activate your My Chart account. Instructions are located on the last page of this paperwork. If you have not heard from us regarding the results in 2 weeks, please contact this office.        Return if symptoms worsen or fail to improve.   Janace Hoardavid , MD 06/21/2018

## 2018-06-21 NOTE — Patient Instructions (Addendum)
Drink lots of fluids and get enough rest.  Take doxycycline 100 mg 1 twice daily for infection  Take benzonatate 1 or 2 pills 3 times daily as needed for daytime cough  Take guaifenesin with codeine cough syrup 1 teaspoon every  6 hours as needed for cough.  This is sedating and is best taken at nighttime.  You can take additional over-the-counter Mucinex if necessary to help thin the secretions.  Return if worse  If you have lab work done today you will be contacted with your lab results within the next 2 weeks.  If you have not heard from us then please contact us. The fastest way to get your results is to register for My Chart.   IF you received an x-ray today, you will receive an invoice from [facility]. Please contact [facility] at [phone] with questions or concerns regarding your invoice.   IF you received labwork today, you will receive an invoice from [facility]. Please contact [facility] at [phone] with questions or concerns regarding your invoice.   Our billing staff will not be able to assist you with questions regarding bills from these companies.  You will be contacted with the lab results as soon as they are available. The fastest way to get your results is to activate your My Chart account. Instructions are located on the last page of this paperwork. If you have not heard from us regarding the results in 2 weeks, please contact this office.

## 2018-06-30 ENCOUNTER — Other Ambulatory Visit (HOSPITAL_COMMUNITY): Payer: 59

## 2018-07-04 ENCOUNTER — Ambulatory Visit: Payer: 59 | Admitting: Surgery

## 2018-07-04 ENCOUNTER — Other Ambulatory Visit (HOSPITAL_COMMUNITY): Payer: 59

## 2018-07-04 ENCOUNTER — Encounter (HOSPITAL_COMMUNITY): Payer: 59

## 2018-08-04 ENCOUNTER — Ambulatory Visit (HOSPITAL_COMMUNITY)
Admission: RE | Admit: 2018-08-04 | Discharge: 2018-08-04 | Disposition: A | Discharge status: Home / Self Care | Payer: 59 | Source: Ambulatory Visit | Attending: Surgery | Admitting: Surgery

## 2018-08-04 DIAGNOSIS — I714 Abdominal aortic aneurysm, without rupture, unspecified: Secondary | ICD-10-CM

## 2018-08-08 ENCOUNTER — Ambulatory Visit (INDEPENDENT_AMBULATORY_CARE_PROVIDER_SITE_OTHER): Payer: 59 | Admitting: Surgery

## 2018-08-08 ENCOUNTER — Other Ambulatory Visit: Payer: Self-pay

## 2018-08-08 ENCOUNTER — Encounter: Payer: Self-pay | Admitting: Surgery

## 2018-08-08 ENCOUNTER — Other Ambulatory Visit (HOSPITAL_COMMUNITY): Payer: Self-pay | Admitting: Surgery

## 2018-08-08 ENCOUNTER — Ambulatory Visit (HOSPITAL_COMMUNITY)
Admission: RE | Admit: 2018-08-08 | Discharge: 2018-08-08 | Disposition: A | Discharge status: Home / Self Care | Payer: 59 | Source: Ambulatory Visit | Attending: Surgery | Admitting: Surgery

## 2018-08-08 ENCOUNTER — Ambulatory Visit (INDEPENDENT_AMBULATORY_CARE_PROVIDER_SITE_OTHER)
Admission: RE | Admit: 2018-08-08 | Discharge: 2018-08-08 | Disposition: A | Discharge status: Home / Self Care | Payer: 59 | Source: Ambulatory Visit | Attending: Surgery | Admitting: Surgery

## 2018-08-08 ENCOUNTER — Ambulatory Visit (HOSPITAL_COMMUNITY)
Admission: RE | Admit: 2018-08-08 | Discharge: 2018-08-08 | Disposition: A | Discharge status: Home / Self Care | Payer: 59 | Source: Ambulatory Visit | Attending: Family | Admitting: Family

## 2018-08-08 VITALS — BP 147/93 | HR 71 | Temp 98.1°F | Resp 18 | Ht 70.0 in | Wt 135.0 lb

## 2018-08-08 DIAGNOSIS — R0989 Other specified symptoms and signs involving the circulatory and respiratory systems: Secondary | ICD-10-CM

## 2018-08-08 DIAGNOSIS — I724 Aneurysm of artery of lower extremity: Secondary | ICD-10-CM

## 2018-08-08 DIAGNOSIS — R29898 Other symptoms and signs involving the musculoskeletal system: Secondary | ICD-10-CM

## 2018-08-08 NOTE — Progress Notes (Signed)
Vascular and Vein [facility]ist of [place]  Patient name: Benjamin BlossomDavid K Yoss MRN: [id number] DOB: 29-Dec-1955 Sex: male   REASON FOR VISIT:    Follow up  HISOTRY OF PRESENT ILLNESS:    This is a 63 y.o. male who is s/p Open repair of bilateral femoral aneurysms with interposition graft between the bilateral commonfemoral and superficial femoral arteries,and re-implantation of bilateral profunda femoral arterieson 01/06/18.   He continues to have significant issues with his activity level.  He complains of bilateral leg pain and weakness in addition to his abdominal pain and back pain.  This is significantly interfering with his ability to work.  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  He is medically managed for hypercholesterolemia with a statin.  He is followed by Dr. Jacinto HalimGanji for his coronary artery disease  PAST MEDICAL HISTORY:   Past Medical History:  Diagnosis Date  . Anxiety   . Arthritis   . COPD (chronic obstructive pulmonary disease) (HCC)   . Dyspnea   . Headache    hx. migraines,sinus headaches  . Peripheral vascular disease (HCC)   . Vertigo      FAMILY HISTORY:   Family History  Problem Relation Age of Onset  . Heart disease Mother     SOCIAL HISTORY:   Social History   Tobacco Use  . Smoking status: Former Smoker    Packs/day: 0.50    Years: 20.00    Pack years: 10.00    Types: Cigarettes    Last attempt to quit: 11/07/2017    Years since quitting: 0.7  . Smokeless tobacco: Former NeurosurgeonUser    Quit date: 07/21/1988  . Tobacco comment: has stopped in last couple weeks  Substance Use Topics  . Alcohol use: Yes    Alcohol/week: 1.0 - 3.0 standard drinks    Types: 1 - 3 Cans of beer per week    Comment: nightly     ALLERGIES:   No Known Allergies   CURRENT MEDICATIONS:   Current Outpatient Medications  Medication Sig Dispense Refill  . aspirin 325 MG EC tablet Take 325 mg by mouth daily.    .  ATROVENT HFA 17 MCG/ACT inhaler TAKE 2 PUFFS BY MOUTH EVERY 6 HOURS AS NEEDED FOR WHEEZE (Patient taking differently: Inhale 2 puffs into the lungs in the morning) 12.9 Inhaler 0  . gabapentin (NEURONTIN) 300 MG capsule Take 1 capsule (300 mg total) by mouth at bedtime. 30 capsule 6  . Multiple Vitamin (MULTIVITAMIN WITH MINERALS) TABS tablet Take 1 tablet by mouth daily.    Marland Kitchen. atorvastatin (LIPITOR) 10 MG tablet Take 1 tablet (10 mg total) by mouth daily. (Patient not taking: Reported on 08/08/2018) 30 tablet 11  . Cholecalciferol (VITAMIN D-3) 5000 units TABS Take 5,000 Units by mouth daily.    . Coenzyme Q10 (COQ10) 100 MG CAPS Take 100 mg by mouth daily.    Marland Kitchen. doxycycline (VIBRA-TABS) 100 MG tablet Take 1 tablet (100 mg total) by mouth 2 (two) times daily. (Patient not taking: Reported on 08/08/2018) 20 tablet 0  . guaiFENesin-codeine (ROBITUSSIN AC) 100-10 MG/5ML syrup Take 5 mLs by mouth 3 (three) times daily as needed for cough. (Patient not taking: Reported on 08/08/2018) 120 mL 0  . Krill Oil 500 MG CAPS Take 500 mg by mouth daily.     No current facility-administered medications for this visit.     REVIEW OF SYSTEMS:   [X]  denotes positive finding, [ ]  denotes negative finding Cardiac  Comments:  Chest pain or chest pressure:    Shortness of breath upon exertion:    Short of breath when lying flat:    Irregular heart rhythm:        Vascular    Pain in calf, thigh, or hip brought on by ambulation:    Pain in feet at night that wakes you up from your sleep:     Blood clot in your veins:    Leg swelling:         Pulmonary    Oxygen at home:    Productive cough:     Wheezing:         Neurologic    Sudden weakness in arms or legs:     Sudden numbness in arms or legs:     Sudden onset of difficulty speaking or slurred speech:    Temporary loss of vision in one eye:     Problems with dizziness:         Gastrointestinal    Blood in stool:     Vomited blood:           Genitourinary    Burning when urinating:     Blood in urine:        Psychiatric    Major depression:         Hematologic    Bleeding problems:    Problems with blood clotting too easily:        Skin    Rashes or ulcers:        Constitutional    Fever or chills:      PHYSICAL EXAM:   Vitals:   08/08/18 1256  BP: (!) 147/93  Pulse: 71  Resp: 18  Temp: 98.1 F (36.7 C)  TempSrc: Oral  SpO2: 98%  Weight: 135 lb (61.2 kg)  Height: 5\' 10"  (1.778 m)    GENERAL: The patient is a well-nourished male, in no acute distress. The vital signs are documented above. CARDIAC: There is a regular rate and rhythm.  PULMONARY: Non-labored respirations ABDOMEN: Soft and non-tender.  Umbilical hernia MUSCULOSKELETAL: There are no major deformities or cyanosis. NEUROLOGIC: No focal weakness or paresthesias are detected. SKIN: There are no ulcers or rashes noted. PSYCHIATRIC: The patient has a normal affect.  STUDIES:   I have ordered and reviewed his vascular lab studies with the following findings: +-------+-----------+-----------+------------+------------+ ABI/TBIToday's ABIToday's TBIPrevious ABIPrevious TBI +-------+-----------+-----------+------------+------------+ Right  1.25       0.59                                +-------+-----------+-----------+------------+------------+ Left   1.25       0.67                                +-------+-----------+-----------+------------+------------+  Right: Widely patent interposition graft between the common femoral and superficial femoral arteries. Proximal and mid popliteal artery aneurysm.  Left: Widely patent interposition graft between the common femoral and superficial femoral arteries. Minimal intimal thickening observed at the distal anastomosis.  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  All his incisions have healed.  His ABIs are normal.  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  I do not think that this operation explains all of his symptoms.  I am concerned that he is got some kind of  progressive degenerative back issue that is complicating his symptoms.  Right popliteal aneurysm: I am going to get a CT scan of this area to see if he is a candidate for endovascular repair.  He will follow-up after the study.  Back pain: We will consider referral to a spine physician after we have resolved his popliteal aneurysm.  General: I am going to get our social worker to help facilitate some of the financial challenges he is having.    Durene CalWells Kaidance Pantoja, MD [facility] Tel [phone] Pager [phone]

## 2018-08-09 ENCOUNTER — Other Ambulatory Visit: Payer: Self-pay

## 2018-08-09 ENCOUNTER — Telehealth (HOSPITAL_COMMUNITY): Payer: Self-pay | Admitting: Licensed Clinical Social Worker

## 2018-08-09 DIAGNOSIS — I724 Aneurysm of artery of lower extremity: Secondary | ICD-10-CM

## 2018-08-09 NOTE — Telephone Encounter (Signed)
CSW received referral from Dr. Myra Gianotti to assist with disability options and financial resources. Patient states he lives alone and works in a [employer] where he has to walk and life boxes throughout the day. He has had multiple medical issues and currently struggling with recovery and added unresolved medical issues and maintaining his employment. Patient states he is overwhelmed with making ends meet and medical needs. He reports his boss has been very supportive and encouraging to him with recovery. He states there is a short term disability and will seek further information about the feasibility of short term disability until he can fully recover. Patient states that he is currently using a space heater in his home as he is out of oil. He also has other bills and feeling a bit overwhelmed. CSW will assist with filling his tank with some oil through the patient assistance fund. He reports he gets paid on Friday and will use that check to pay his February rent. Patient very grateful for the assistance and will follow up with his employer regarding short term disability. CSW will continue to be available as needed to help patient navigate the healthcare system. Lasandra Beech, LCSW, CCSW-MCS [phone]

## 2018-08-15 ENCOUNTER — Encounter (HOSPITAL_COMMUNITY): Payer: 59

## 2018-08-15 ENCOUNTER — Other Ambulatory Visit (HOSPITAL_COMMUNITY): Payer: 59

## 2018-08-15 ENCOUNTER — Ambulatory Visit: Payer: 59 | Admitting: Surgery

## 2018-09-01 ENCOUNTER — Ambulatory Visit
Admission: RE | Admit: 2018-09-01 | Discharge: 2018-09-01 | Disposition: A | Discharge status: Home / Self Care | Payer: 59 | Source: Ambulatory Visit | Attending: Surgery | Admitting: Surgery

## 2018-09-01 DIAGNOSIS — I724 Aneurysm of artery of lower extremity: Secondary | ICD-10-CM

## 2018-09-01 MED ORDER — IOPAMIDOL (ISOVUE-370) INJECTION 76%
100.0000 mL | Freq: Once | INTRAVENOUS | Status: AC | PRN
  Administered 2018-09-01: 100 mL via INTRAVENOUS

## 2018-09-12 ENCOUNTER — Ambulatory Visit (INDEPENDENT_AMBULATORY_CARE_PROVIDER_SITE_OTHER): Payer: 59 | Admitting: Surgery

## 2018-09-12 ENCOUNTER — Other Ambulatory Visit: Payer: Self-pay | Admitting: *Deleted

## 2018-09-12 ENCOUNTER — Encounter: Payer: Self-pay | Admitting: Surgery

## 2018-09-12 ENCOUNTER — Other Ambulatory Visit: Payer: Self-pay

## 2018-09-12 VITALS — BP 141/84 | HR 83 | Temp 98.4°F | Resp 20 | Ht 70.0 in | Wt 145.5 lb

## 2018-09-12 DIAGNOSIS — I724 Aneurysm of artery of lower extremity: Secondary | ICD-10-CM

## 2018-09-12 NOTE — Progress Notes (Signed)
[facility]ist [facility] [place]  Patient name: Benjamin Jenkins MRN: [id number] DOB: 02-26-1956 Sex: male   REASON FOR VISIT:    Follow up  HISOTRY OF PRESENT ILLNESS:    This is a 63 y.o.malewho is s/p Open repair of bilateral femoral aneurysms with interposition graft between the bilateral commonfemoral and superficial femoral arteries,and re-implantation of bilateral profunda femoral arterieson 01/06/18.   He continues to have significant issues with his activity level.  He complains of bilateral leg pain and weakness in addition to his abdominal pain and back pain.  This is significantly interfering with his ability to work.  I sent him for CT scan to evaluate whether or not he is a candidate for popliteal aneurysm repair via stenting.  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 He is medically managed for hypercholesterolemia with a statin. He is followed by Dr. Belva Chimes his coronary artery disease  PAST MEDICAL HISTORY:   Past Medical History:  Diagnosis Date  . Anxiety   . Arthritis   . COPD (chronic obstructive pulmonary disease) (HCC)   . Dyspnea   . Headache    hx. migraines,sinus headaches  . Peripheral vascular disease (HCC)   . Vertigo      FAMILY HISTORY:   Family History  Problem Relation Age of Onset  . Heart disease Mother     SOCIAL HISTORY:   Social History   Tobacco Use  . Smoking status: Former Smoker    Packs/day: 0.50    Years: 20.00    Pack years: 10.00    Types: Cigarettes    Last attempt to quit: 11/07/2017    Years since quitting: 0.8  . Smokeless tobacco: Former Neurosurgeon    Quit date: 07/21/1988  . Tobacco comment: has stopped in last couple weeks  Substance Use Topics  . Alcohol use: Yes    Alcohol/week: 1.0 - 3.0 standard drinks    Types: 1 - 3 Cans of beer per week    Comment: nightly     ALLERGIES:   No Known  Allergies   CURRENT MEDICATIONS:   Current Outpatient Medications  Medication Sig Dispense Refill  . aspirin 325 MG EC tablet Take 325 mg by mouth daily.    Marland Kitchen atorvastatin (LIPITOR) 10 MG tablet Take 1 tablet (10 mg total) by mouth daily. 30 tablet 11  . ATROVENT HFA 17 MCG/ACT inhaler TAKE 2 PUFFS BY MOUTH EVERY 6 HOURS AS NEEDED FOR WHEEZE (Patient taking differently: Inhale 2 puffs into the lungs in the morning) 12.9 Inhaler 0  . Cholecalciferol (VITAMIN D-3) 5000 units TABS Take 5,000 Units by mouth daily.    . Coenzyme Q10 (COQ10) 100 MG CAPS Take 100 mg by mouth daily.    Marland Kitchen gabapentin (NEURONTIN) 300 MG capsule Take 1 capsule (300 mg total) by mouth at bedtime. 30 capsule 6  . Krill Oil 500 MG CAPS Take 500 mg by mouth daily.    . Multiple Vitamin (MULTIVITAMIN WITH MINERALS) TABS tablet Take 1 tablet by mouth daily.    . sertraline (ZOLOFT) 50 MG tablet TAKE 1 TABLET BY MOUTH ONCE DAILY AFTER A MEAL IN THE EVENING     No current facility-administered medications for this visit.     REVIEW OF SYSTEMS:   [X]  denotes positive finding, [ ]  denotes negative finding Cardiac  Comments:  Chest pain or chest pressure:    Shortness of breath upon exertion:    Short  of breath when lying flat:    Irregular heart rhythm:        Vascular    Pain in calf, thigh, or hip brought on by ambulation:    Pain in feet at night that wakes you up from your sleep:     Blood clot in your veins:    Leg swelling:         Pulmonary    Oxygen at home:    Productive cough:     Wheezing:         Neurologic    Sudden weakness in arms or legs:     Sudden numbness in arms or legs:     Sudden onset of difficulty speaking or slurred speech:    Temporary loss of vision in one eye:     Problems with dizziness:         Gastrointestinal    Blood in stool:     Vomited blood:         Genitourinary    Burning when urinating:     Blood in urine:        Psychiatric    Major depression:          Hematologic    Bleeding problems:    Problems with blood clotting too easily:        Skin    Rashes or ulcers:        Constitutional    Fever or chills:      PHYSICAL EXAM:   Vitals:   09/12/18 1338  BP: (!) 141/84  Pulse: 83  Resp: 20  Temp: 98.4 F (36.9 C)  SpO2: 96%  Weight: 145 lb 8.1 oz (66 kg)  Height: 5\' 10"  (1.778 m)    GENERAL: The patient is a well-nourished male, in no acute distress. The vital signs are documented above. CARDIAC: There is a regular rate and rhythm.  VASCULAR: Palpable pedal pulses PULMONARY: Non-labored respirations ABDOMEN: Soft and non-tender with normal pitched bowel sounds.  MUSCULOSKELETAL: There are no major deformities or cyanosis. NEUROLOGIC: No focal weakness or paresthesias are detected. SKIN: There are no ulcers or rashes noted. PSYCHIATRIC: The patient has a normal affect.  STUDIES:   I have reviewed his CTA with the following findings: 3cm right popliteal aneurysm  MEDICAL ISSUES:   Right popliteal aneurysm: We discussed open versus percutaneous treatment.  We have elected to proceed with stenting.  I will need to determine whether or not this is done through an antegrade or retrograde approach, and possibly requiring open treatment.  He has been scheduled for March 6    Durene Cal, MD Vascular and Vein Specialists of [facility] [phone] Pager [phone]

## 2018-09-12 NOTE — Progress Notes (Signed)
Pt received pre op letter with instructions. No further questions at this time.

## 2018-09-12 NOTE — H&P (View-Only) (Signed)
[facility] and [facility]in Specialist of [place]  Patient name: Benjamin Jenkins MRN: [id number] DOB: 02-26-1956 Sex: male   REASON FOR VISIT:    Follow up  HISOTRY OF PRESENT ILLNESS:    This is a 63 y.o.malewho is s/p Open repair of bilateral femoral aneurysms with interposition graft between the bilateral commonfemoral and superficial femoral arteries,and re-implantation of bilateral profunda femoral arterieson 01/06/18.   He continues to have significant issues with his activity level.  He complains of bilateral leg pain and weakness in addition to his abdominal pain and back pain.  This is significantly interfering with his ability to work.  I sent him for CT scan to evaluate whether or not he is a candidate for popliteal aneurysm repair via stenting.  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 He is medically managed for hypercholesterolemia with a statin. He is followed by Dr. Belva Chimes his coronary artery disease  PAST MEDICAL HISTORY:   Past Medical History:  Diagnosis Date  . Anxiety   . Arthritis   . COPD (chronic obstructive pulmonary disease) (HCC)   . Dyspnea   . Headache    hx. migraines,sinus headaches  . Peripheral vascular disease (HCC)   . Vertigo      FAMILY HISTORY:   Family History  Problem Relation Age of Onset  . Heart disease Mother     SOCIAL HISTORY:   Social History   Tobacco Use  . Smoking status: Former Smoker    Packs/day: 0.50    Years: 20.00    Pack years: 10.00    Types: Cigarettes    Last attempt to quit: 11/07/2017    Years since quitting: 0.8  . Smokeless tobacco: Former Neurosurgeon    Quit date: 07/21/1988  . Tobacco comment: has stopped in last couple weeks  Substance Use Topics  . Alcohol use: Yes    Alcohol/week: 1.0 - 3.0 standard drinks    Types: 1 - 3 Cans of beer per week    Comment: nightly     ALLERGIES:   No Known  Allergies   CURRENT MEDICATIONS:   Current Outpatient Medications  Medication Sig Dispense Refill  . aspirin 325 MG EC tablet Take 325 mg by mouth daily.    Marland Kitchen atorvastatin (LIPITOR) 10 MG tablet Take 1 tablet (10 mg total) by mouth daily. 30 tablet 11  . ATROVENT HFA 17 MCG/ACT inhaler TAKE 2 PUFFS BY MOUTH EVERY 6 HOURS AS NEEDED FOR WHEEZE (Patient taking differently: Inhale 2 puffs into the lungs in the morning) 12.9 Inhaler 0  . Cholecalciferol (VITAMIN D-3) 5000 units TABS Take 5,000 Units by mouth daily.    . Coenzyme Q10 (COQ10) 100 MG CAPS Take 100 mg by mouth daily.    Marland Kitchen gabapentin (NEURONTIN) 300 MG capsule Take 1 capsule (300 mg total) by mouth at bedtime. 30 capsule 6  . Krill Oil 500 MG CAPS Take 500 mg by mouth daily.    . Multiple Vitamin (MULTIVITAMIN WITH MINERALS) TABS tablet Take 1 tablet by mouth daily.    . sertraline (ZOLOFT) 50 MG tablet TAKE 1 TABLET BY MOUTH ONCE DAILY AFTER A MEAL IN THE EVENING     No current facility-administered medications for this visit.     REVIEW OF SYSTEMS:   [X]  denotes positive finding, [ ]  denotes negative finding Cardiac  Comments:  Chest pain or chest pressure:    Shortness of breath upon exertion:    Short  of breath when lying flat:    Irregular heart rhythm:        Vascular    Pain in calf, thigh, or hip brought on by ambulation:    Pain in feet at night that wakes you up from your sleep:     Blood clot in your veins:    Leg swelling:         Pulmonary    Oxygen at home:    Productive cough:     Wheezing:         Neurologic    Sudden weakness in arms or legs:     Sudden numbness in arms or legs:     Sudden onset of difficulty speaking or slurred speech:    Temporary loss of vision in one eye:     Problems with dizziness:         Gastrointestinal    Blood in stool:     Vomited blood:         Genitourinary    Burning when urinating:     Blood in urine:        Psychiatric    Major depression:          Hematologic    Bleeding problems:    Problems with blood clotting too easily:        Skin    Rashes or ulcers:        Constitutional    Fever or chills:      PHYSICAL EXAM:   Vitals:   09/12/18 1338  BP: (!) 141/84  Pulse: 83  Resp: 20  Temp: 98.4 F (36.9 C)  SpO2: 96%  Weight: 145 lb 8.1 oz (66 kg)  Height: 5' 10" (1.778 m)    GENERAL: The patient is a well-nourished male, in no acute distress. The vital signs are documented above. CARDIAC: There is a regular rate and rhythm.  VASCULAR: Palpable pedal pulses PULMONARY: Non-labored respirations ABDOMEN: Soft and non-tender with normal pitched bowel sounds.  MUSCULOSKELETAL: There are no major deformities or cyanosis. NEUROLOGIC: No focal weakness or paresthesias are detected. SKIN: There are no ulcers or rashes noted. PSYCHIATRIC: The patient has a normal affect.  STUDIES:   I have reviewed his CTA with the following findings: 3cm right popliteal aneurysm  MEDICAL ISSUES:   Right popliteal aneurysm: We discussed open versus percutaneous treatment.  We have elected to proceed with stenting.  I will need to determine whether or not this is done through an antegrade or retrograde approach, and possibly requiring open treatment.  He has been scheduled for March 6    Wells Brabham, MD [facility] of [place] Tel [phone] Pager [phone] 

## 2018-09-19 ENCOUNTER — Telehealth (HOSPITAL_COMMUNITY): Payer: Self-pay | Admitting: Licensed Clinical Social Worker

## 2018-09-19 NOTE — Pre-Procedure Instructions (Signed)
Benjamin Jenkins  09/19/2018    Your procedure is scheduled on Friday, March 6.  Report to [facility], Main Entrance or Entrance "A"at 8:30 AM                Your surgery or procedure is scheduled for 10:30 A.M.   Call this number if you have problems the morning of surgery: [phone]  This is the number for the Pre- Surgical Desk.    Remember:  Do not eat or drink after midnight Thursday, March 5.     Take these medicines the morning of surgery with A SIP OF WATER:  Aspirin if instructed by Dr. Myra Gianotti  Use if needed:ATROVENT HFA 17 MCG/ACT inhaler, please bring it to the hospital with you. Special instructions:  [place]- Preparing For Surgery  Before surgery, you can play an important role. Because skin is not sterile, your skin needs to be as free of germs as possible. You can reduce the number of germs on your skin by washing with CHG (chlorahexidine gluconate) Soap before surgery.  CHG is an antiseptic cleaner which kills germs and bonds with the skin to continue killing germs even after washing.    Oral Hygiene is also important to reduce your risk of infection.  Remember - BRUSH YOUR TEETH THE MORNING OF SURGERY WITH YOUR REGULAR TOOTHPASTE  Please do not use if you have an allergy to CHG or antibacterial soaps. If your skin becomes reddened/irritated stop using the CHG.  Do not shave (including legs and underarms) for at least 48 hours prior to first CHG shower. It is OK to shave your face.  Please follow these instructions carefully.   1. Shower the NIGHT BEFORE SURGERY and the MORNING OF SURGERY with CHG.   2. If you chose to wash your hair, wash your hair first as usual with your normal shampoo.  3. After you shampoo, wash your face and private area with the soap you use at home, then rinse your hair and body thoroughly to remove the shampoo and soap.  4. Use CHG as you would any other liquid soap. You can apply CHG directly to the skin and wash  gently with a scrungie or a clean washcloth.   5. Apply the CHG Soap to your body ONLY FROM THE NECK DOWN.  Do not use on open wounds or open sores. Avoid contact with your eyes, ears, mouth and genitals (private parts).   6. Wash thoroughly, paying special attention to the area where your surgery will be performed.  7. Thoroughly rinse your body with warm water from the neck down.  8. DO NOT shower/wash with your normal soap after using and rinsing off the CHG Soap.  9. Pat yourself dry with a CLEAN TOWEL.  10. Wear CLEAN PAJAMAS to bed the night before surgery, wear comfortable clothes the morning of surgery  11. Place CLEAN SHEETS on your bed the night of your first shower and DO NOT SLEEP WITH PETS.  Day of Surgery: Shower as written above Do not apply any deodorants/lotions, powders or colognes.  Please wear clean clothes to the hospital/surgery center.   Remember to brush your teeth WITH YOUR REGULAR TOOTHPASTE.  Do not wear jewelry, make-up or nail polish.  Do not shave 48 hours prior to surgery.  Men may shave face and neck.  Do not bring valuables to the hospital.  [facility] is not responsible for any belongings or valuables.  Contacts, dentures or  bridgework may not be worn into surgery.  Leave your suitcase in the car.  After surgery it may be brought to your room.  For patients admitted to the hospital, discharge time will be determined by your treatment team.  Patients discharged the day of surgery will not be allowed to drive home.   Please read over the following fact sheets that you were given: Pain Booklet, Patient Instructions for Mupirocin Application, Coughing,Surgical Site Infections.

## 2018-09-19 NOTE — Telephone Encounter (Signed)
CSW received call from patient stating his rent is due and his short term disability has not come through yet. Patient stated he has planned surgery this Friday with Dr. Myra Gianotti and "sacred" because I don't have the money and don't want to be evicted. CSW obtained approval to assist patient with monthly rent via the Patient Assistance Fund. Patient is very grateful and relived with the assistance. Patient stated he feels more relaxed going into surgery with the help from the fund. CSW provided support and contacted landlord to confirm rent check will be mailed when ready. CSW available as needed. Lasandra Beech, LCSW, CCSW-MCS [phone]

## 2018-09-20 ENCOUNTER — Encounter (HOSPITAL_COMMUNITY): Payer: Self-pay

## 2018-09-20 ENCOUNTER — Other Ambulatory Visit: Payer: Self-pay

## 2018-09-20 ENCOUNTER — Encounter (HOSPITAL_COMMUNITY)
Admission: RE | Admit: 2018-09-20 | Discharge: 2018-09-20 | Disposition: A | Discharge status: Home / Self Care | Payer: 59 | Source: Ambulatory Visit | Attending: Surgery | Admitting: Surgery

## 2018-09-20 DIAGNOSIS — Z01818 Encounter for other preprocedural examination: Secondary | ICD-10-CM | POA: Insufficient documentation

## 2018-09-20 HISTORY — DX: Concussion with loss of consciousness of unspecified duration, initial encounter: S06.0X9A

## 2018-09-20 HISTORY — DX: Depression, unspecified: F32.A

## 2018-09-20 HISTORY — DX: Major depressive disorder, single episode, unspecified: F32.9

## 2018-09-20 HISTORY — DX: Concussion with loss of consciousness status unknown, initial encounter: S06.0XAA

## 2018-09-20 LAB — CBC
HCT: 43.2 % (ref 39.0–52.0)
Hemoglobin: 15 g/dL (ref 13.0–17.0)
MCH: 33.6 pg (ref 26.0–34.0)
MCHC: 34.7 g/dL (ref 30.0–36.0)
MCV: 96.6 fL (ref 80.0–100.0)
NRBC: 0 % (ref 0.0–0.2)
Platelets: 454 10*3/uL — ABNORMAL HIGH (ref 150–400)
RBC: 4.47 MIL/uL (ref 4.22–5.81)
RDW: 12.4 % (ref 11.5–15.5)
WBC: 9 10*3/uL (ref 4.0–10.5)

## 2018-09-20 LAB — SURGICAL PCR SCREEN
MRSA, PCR: NEGATIVE
Staphylococcus aureus: NEGATIVE

## 2018-09-20 NOTE — Progress Notes (Signed)
PCP - Dr Luciana Axe [facility]  Cardiologist - saw Dr Normajean Baxter in 2018  He has not followed up  Pulmonologist - Dr Vassie Loll -   Chest x-ray - na  EKG - 09/20/2018 Stress Test - 2018  ECHO -   Cardiac Cath - 2018   Sleep Study - no CPAP - no  LABS-CBC, BMP  ASA- was not instructed to stop  HA1C-NA Fasting Blood Sugar - NA Checks Blood Sugar ___0__ times a day  Anesthesia-na  Pt denies having chest pain, sob, or fever at this time. All instructions explained to the pt, with a verbal understanding of the material. Pt agrees to go over the instructions while at home for a better understanding. The opportunity to ask questions was provided.

## 2018-09-22 NOTE — Anesthesia Preprocedure Evaluation (Addendum)
Anesthesia Evaluation  Patient identified by MRN, date of birth, ID band Patient awake    Reviewed: Allergy & Precautions, H&P , NPO status , Patient's Chart, lab work & pertinent test results  Airway Mallampati: II  TM Distance: >3 FB Neck ROM: full    Dental  (+) Poor Dentition, Dental Advidsory Given   Pulmonary shortness of breath, COPD, former smoker,    breath sounds clear to auscultation       Cardiovascular + CAD and + Peripheral Vascular Disease   Rhythm:Regular Rate:Normal     Neuro/Psych  Headaches, PSYCHIATRIC DISORDERS Anxiety Depression    GI/Hepatic negative GI ROS, Neg liver ROS,   Endo/Other  negative endocrine ROS  Renal/GU negative Renal ROS     Musculoskeletal   Abdominal   Peds  Hematology   Anesthesia Other Findings   Reproductive/Obstetrics                           Anesthesia Physical Anesthesia Plan  ASA: III  Anesthesia Plan: General   Post-op Pain Management:    Induction: Intravenous  PONV Risk Score and Plan: Ondansetron, Dexamethasone and Midazolam  Airway Management Planned: Oral ETT  Additional Equipment:   Intra-op Plan:   Post-operative Plan:   Informed Consent:     Dental Advisory Given  Plan Discussed with: Anesthesiologist and CRNA  Anesthesia Plan Comments:       Anesthesia Quick Evaluation

## 2018-09-23 ENCOUNTER — Inpatient Hospital Stay (HOSPITAL_COMMUNITY): Payer: 59 | Admitting: Certified Registered Nurse Anesthetist

## 2018-09-23 ENCOUNTER — Other Ambulatory Visit: Payer: Self-pay

## 2018-09-23 ENCOUNTER — Encounter (HOSPITAL_COMMUNITY): Payer: Self-pay | Admitting: *Deleted

## 2018-09-23 ENCOUNTER — Inpatient Hospital Stay (HOSPITAL_COMMUNITY)
Admission: RE | Admit: 2018-09-23 | Discharge: 2018-09-24 | DRG: 272 | Disposition: A | Discharge status: Home / Self Care | Payer: 59 | Attending: Surgery | Admitting: Surgery

## 2018-09-23 ENCOUNTER — Encounter (HOSPITAL_COMMUNITY)
Admission: RE | Disposition: A | Discharge status: Home / Self Care | Payer: Self-pay | Source: Home / Self Care | Attending: Surgery

## 2018-09-23 DIAGNOSIS — J449 Chronic obstructive pulmonary disease, unspecified: Secondary | ICD-10-CM | POA: Diagnosis present

## 2018-09-23 DIAGNOSIS — E7800 Pure hypercholesterolemia, unspecified: Secondary | ICD-10-CM | POA: Diagnosis present

## 2018-09-23 DIAGNOSIS — F419 Anxiety disorder, unspecified: Secondary | ICD-10-CM | POA: Diagnosis present

## 2018-09-23 DIAGNOSIS — I724 Aneurysm of artery of lower extremity: Secondary | ICD-10-CM

## 2018-09-23 DIAGNOSIS — Z79899 Other long term (current) drug therapy: Secondary | ICD-10-CM

## 2018-09-23 DIAGNOSIS — Z8249 Family history of ischemic heart disease and other diseases of the circulatory system: Secondary | ICD-10-CM | POA: Diagnosis not present

## 2018-09-23 DIAGNOSIS — Z87891 Personal history of nicotine dependence: Secondary | ICD-10-CM | POA: Diagnosis not present

## 2018-09-23 DIAGNOSIS — F329 Major depressive disorder, single episode, unspecified: Secondary | ICD-10-CM | POA: Diagnosis present

## 2018-09-23 DIAGNOSIS — Z7982 Long term (current) use of aspirin: Secondary | ICD-10-CM | POA: Diagnosis not present

## 2018-09-23 DIAGNOSIS — I2510 Atherosclerotic heart disease of native coronary artery without angina pectoris: Secondary | ICD-10-CM | POA: Diagnosis present

## 2018-09-23 DIAGNOSIS — I739 Peripheral vascular disease, unspecified: Secondary | ICD-10-CM | POA: Diagnosis present

## 2018-09-23 HISTORY — PX: INSERTION OF ILIAC STENT: SHX6256

## 2018-09-23 LAB — COMPREHENSIVE METABOLIC PANEL
ALT: 21 U/L (ref 0–44)
AST: 24 U/L (ref 15–41)
Albumin: 4.1 g/dL (ref 3.5–5.0)
Alkaline Phosphatase: 76 U/L (ref 38–126)
Anion gap: 12 (ref 5–15)
BILIRUBIN TOTAL: 0.6 mg/dL (ref 0.3–1.2)
BUN: 11 mg/dL (ref 6–20)
CO2: 19 mmol/L — ABNORMAL LOW (ref 22–32)
Calcium: 9.1 mg/dL (ref 8.9–10.3)
Chloride: 104 mmol/L (ref 98–111)
Creatinine, Ser: 0.82 mg/dL (ref 0.61–1.24)
GFR calc Af Amer: >60 mL/min (ref >60)
GFR calc non Af Amer: >60 mL/min (ref >60)
Glucose, Bld: 103 mg/dL — ABNORMAL HIGH (ref 70–99)
POTASSIUM: 3.3 mmol/L — AB (ref 3.5–5.1)
Sodium: 135 mmol/L (ref 135–145)
TOTAL PROTEIN: 6.6 g/dL (ref 6.5–8.1)

## 2018-09-23 LAB — POCT ACTIVATED CLOTTING TIME: Activated Clotting Time: 208 seconds

## 2018-09-23 SURGERY — INSERTION, STENT, ARTERY, ILIAC
Anesthesia: General | Site: Leg Lower | Laterality: Right

## 2018-09-23 MED ORDER — SODIUM CHLORIDE 0.9% FLUSH: 3.0000 mL | INTRAVENOUS | Status: DC | PRN

## 2018-09-23 MED ORDER — SODIUM CHLORIDE 0.9 % IV SOLN
INTRAVENOUS | Status: AC
  Filled 2018-09-23: qty 1.2

## 2018-09-23 MED ORDER — SODIUM CHLORIDE 0.9 % IV SOLN: INTRAVENOUS | Status: DC

## 2018-09-23 MED ORDER — MIDAZOLAM HCL 5 MG/5ML IJ SOLN
INTRAMUSCULAR | Status: DC | PRN
  Administered 2018-09-23: 2 mg via INTRAVENOUS

## 2018-09-23 MED ORDER — PROPOFOL 10 MG/ML IV BOLUS
INTRAVENOUS | Status: AC
  Filled 2018-09-23: qty 20

## 2018-09-23 MED ORDER — MORPHINE SULFATE (PF) 2 MG/ML IV SOLN
2.0000 mg | INTRAVENOUS | Status: DC | PRN
  Administered 2018-09-23 (×2): 2 mg via INTRAVENOUS
  Filled 2018-09-23 (×2): qty 1

## 2018-09-23 MED ORDER — HYDRALAZINE HCL 20 MG/ML IJ SOLN: 5.0000 mg | INTRAMUSCULAR | Status: DC | PRN

## 2018-09-23 MED ORDER — CEFAZOLIN SODIUM-DEXTROSE 2-4 GM/100ML-% IV SOLN
2.0000 g | INTRAVENOUS | Status: AC
  Administered 2018-09-23: 2 g via INTRAVENOUS

## 2018-09-23 MED ORDER — ONDANSETRON HCL 4 MG/2ML IJ SOLN: 4.0000 mg | Freq: Four times a day (QID) | INTRAMUSCULAR | Status: DC | PRN

## 2018-09-23 MED ORDER — HEPARIN SODIUM (PORCINE) 1000 UNIT/ML IJ SOLN
INTRAMUSCULAR | Status: DC | PRN
  Administered 2018-09-23: 6500 [IU] via INTRAVENOUS

## 2018-09-23 MED ORDER — FENTANYL CITRATE (PF) 100 MCG/2ML IJ SOLN
INTRAMUSCULAR | Status: DC | PRN
  Administered 2018-09-23: 150 ug via INTRAVENOUS

## 2018-09-23 MED ORDER — CHLORHEXIDINE GLUCONATE 4 % EX LIQD: 60.0000 mL | Freq: Once | CUTANEOUS | Status: DC

## 2018-09-23 MED ORDER — ACETAMINOPHEN 325 MG PO TABS: 650.0000 mg | ORAL_TABLET | ORAL | Status: DC | PRN

## 2018-09-23 MED ORDER — LABETALOL HCL 5 MG/ML IV SOLN: 10.0000 mg | INTRAVENOUS | Status: DC | PRN

## 2018-09-23 MED ORDER — EPHEDRINE SULFATE 50 MG/ML IJ SOLN
INTRAMUSCULAR | Status: DC | PRN
  Administered 2018-09-23 (×2): 10 mg via INTRAVENOUS

## 2018-09-23 MED ORDER — LIDOCAINE 2% (20 MG/ML) 5 ML SYRINGE
INTRAMUSCULAR | Status: DC | PRN
  Administered 2018-09-23: 60 mg via INTRAVENOUS

## 2018-09-23 MED ORDER — ONDANSETRON HCL 4 MG/2ML IJ SOLN
INTRAMUSCULAR | Status: DC | PRN
  Administered 2018-09-23: 4 mg via INTRAVENOUS

## 2018-09-23 MED ORDER — SODIUM CHLORIDE 0.9 % IV SOLN
INTRAVENOUS | Status: DC | PRN
  Administered 2018-09-23: 25 ug/min via INTRAVENOUS

## 2018-09-23 MED ORDER — FENTANYL CITRATE (PF) 250 MCG/5ML IJ SOLN
INTRAMUSCULAR | Status: AC
  Filled 2018-09-23: qty 5

## 2018-09-23 MED ORDER — MIDAZOLAM HCL 2 MG/2ML IJ SOLN
INTRAMUSCULAR | Status: AC
  Filled 2018-09-23: qty 2

## 2018-09-23 MED ORDER — PHENYLEPHRINE HCL 10 MG/ML IJ SOLN
INTRAMUSCULAR | Status: DC | PRN
  Administered 2018-09-23: 80 ug via INTRAVENOUS

## 2018-09-23 MED ORDER — OXYCODONE HCL 5 MG PO TABS
5.0000 mg | ORAL_TABLET | ORAL | Status: DC | PRN
  Administered 2018-09-23 – 2018-09-24 (×4): 10 mg via ORAL
  Filled 2018-09-23 (×4): qty 2

## 2018-09-23 MED ORDER — CLOPIDOGREL BISULFATE 75 MG PO TABS
75.0000 mg | ORAL_TABLET | Freq: Every day | ORAL | Status: DC
  Administered 2018-09-24: 75 mg via ORAL
  Filled 2018-09-23: qty 1

## 2018-09-23 MED ORDER — IODIXANOL 320 MG/ML IV SOLN
INTRAVENOUS | Status: DC | PRN
  Administered 2018-09-23: 50 mL

## 2018-09-23 MED ORDER — ASPIRIN EC 81 MG PO TBEC
81.0000 mg | DELAYED_RELEASE_TABLET | Freq: Every day | ORAL | Status: DC
  Administered 2018-09-23 – 2018-09-24 (×2): 81 mg via ORAL
  Filled 2018-09-23 (×2): qty 1

## 2018-09-23 MED ORDER — PROPOFOL 10 MG/ML IV BOLUS
INTRAVENOUS | Status: DC | PRN
  Administered 2018-09-23: 150 mg via INTRAVENOUS

## 2018-09-23 MED ORDER — PROTAMINE SULFATE 10 MG/ML IV SOLN
INTRAVENOUS | Status: DC | PRN
  Administered 2018-09-23: 30 mg via INTRAVENOUS
  Administered 2018-09-23: 20 mg via INTRAVENOUS

## 2018-09-23 MED ORDER — DEXAMETHASONE SODIUM PHOSPHATE 10 MG/ML IJ SOLN
INTRAMUSCULAR | Status: DC | PRN
  Administered 2018-09-23: 10 mg via INTRAVENOUS

## 2018-09-23 MED ORDER — SODIUM CHLORIDE 0.9 % WEIGHT BASED INFUSION
1.0000 mL/kg/h | INTRAVENOUS | Status: AC
  Administered 2018-09-23: 1 mL/kg/h via INTRAVENOUS

## 2018-09-23 MED ORDER — SODIUM CHLORIDE 0.9% FLUSH: 3.0000 mL | Freq: Two times a day (BID) | INTRAVENOUS | Status: DC

## 2018-09-23 MED ORDER — SODIUM CHLORIDE 0.9 % IV SOLN
INTRAVENOUS | Status: DC | PRN
  Administered 2018-09-23: 09:00:00

## 2018-09-23 MED ORDER — 0.9 % SODIUM CHLORIDE (POUR BTL) OPTIME
TOPICAL | Status: DC | PRN
  Administered 2018-09-23: 1000 mL

## 2018-09-23 MED ORDER — CEFAZOLIN SODIUM-DEXTROSE 2-4 GM/100ML-% IV SOLN
INTRAVENOUS | Status: AC
  Filled 2018-09-23: qty 100

## 2018-09-23 MED ORDER — FENTANYL CITRATE (PF) 100 MCG/2ML IJ SOLN
INTRAMUSCULAR | Status: AC
  Filled 2018-09-23: qty 2

## 2018-09-23 MED ORDER — LACTATED RINGERS IV SOLN
INTRAVENOUS | Status: DC | PRN
  Administered 2018-09-23 (×2): via INTRAVENOUS

## 2018-09-23 MED ORDER — SODIUM CHLORIDE 0.9 % IV SOLN: 250.0000 mL | INTRAVENOUS | Status: DC | PRN

## 2018-09-23 MED ORDER — FENTANYL CITRATE (PF) 100 MCG/2ML IJ SOLN
25.0000 ug | INTRAMUSCULAR | Status: DC | PRN
  Administered 2018-09-23: 50 ug via INTRAVENOUS

## 2018-09-23 SURGICAL SUPPLY — 49 items
ADH SKN CLS APL DERMABOND .7 (GAUZE/BANDAGES/DRESSINGS) ×2
BALLN MUSTANG 9X80X135 (BALLOONS) ×3
BALLOON MUSTANG 9X80X135 (BALLOONS) IMPLANT
BOOT SUTURE AID YELLOW STND (SUTURE) ×2 IMPLANT
CANISTER SUCT 3000ML PPV (MISCELLANEOUS) ×3 IMPLANT
CATH OMNI FLUSH 5F 65CM (CATHETERS) ×3 IMPLANT
CATH SOFT-VU 4F 65 STRAIGHT (CATHETERS) IMPLANT
CATH SOFT-VU STRAIGHT 4F 65CM (CATHETERS) ×3
COVER WAND RF STERILE (DRAPES) ×1 IMPLANT
DERMABOND ADVANCED (GAUZE/BANDAGES/DRESSINGS) ×4
DERMABOND ADVANCED .7 DNX12 (GAUZE/BANDAGES/DRESSINGS) ×1 IMPLANT
DRAPE C-ARM 42X72 X-RAY (DRAPES) ×1 IMPLANT
DRYSEAL FLEXSHEATH 12FR 33CM (SHEATH) ×2
ELECT REM PT RETURN 9FT ADLT (ELECTROSURGICAL) ×3
ELECTRODE REM PT RTRN 9FT ADLT (ELECTROSURGICAL) ×1 IMPLANT
GAUZE 4X4 16PLY RFD (DISPOSABLE) ×2 IMPLANT
GLOVE BIOGEL PI IND STRL 7.5 (GLOVE) ×1 IMPLANT
GLOVE BIOGEL PI INDICATOR 7.5 (GLOVE) ×2
GLOVE SURG SS PI 7.5 STRL IVOR (GLOVE) ×3 IMPLANT
GOWN STRL REUS W/ TWL LRG LVL3 (GOWN DISPOSABLE) ×2 IMPLANT
GOWN STRL REUS W/ TWL XL LVL3 (GOWN DISPOSABLE) ×1 IMPLANT
GOWN STRL REUS W/TWL LRG LVL3 (GOWN DISPOSABLE) ×3
GOWN STRL REUS W/TWL XL LVL3 (GOWN DISPOSABLE) ×3
GUIDEWIRE ANGLED .035X150CM (WIRE) ×4 IMPLANT
GUIDEWIRE BENTSON (WIRE) ×3 IMPLANT
GUIDEWIRE SAF TJ AMPL .035X180 (WIRE) ×2 IMPLANT
KIT BASIN OR (CUSTOM PROCEDURE TRAY) ×3 IMPLANT
KIT ENCORE 26 ADVANTAGE (KITS) IMPLANT
KIT TURNOVER KIT B (KITS) ×3 IMPLANT
NS IRRIG 1000ML POUR BTL (IV SOLUTION) ×4 IMPLANT
PACK ENDO MINOR (CUSTOM PROCEDURE TRAY) ×3 IMPLANT
PACK UNIVERSAL I (CUSTOM PROCEDURE TRAY) ×2 IMPLANT
PAD ARMBOARD 7.5X6 YLW CONV (MISCELLANEOUS) ×5 IMPLANT
SET MICROPUNCTURE 5F STIFF (MISCELLANEOUS) ×5 IMPLANT
SHEATH DRYSEAL FLEX 12FR 33CM (SHEATH) IMPLANT
SHEATH PINNACLE 5F 10CM (SHEATH) ×1 IMPLANT
SHEATH PINNACLE 5FR (SHEATH) ×3 IMPLANT
SHEATH PINNACLE 8F 10CM (SHEATH) ×2 IMPLANT
SHIELD RADPAD SCOOP 12X17 (MISCELLANEOUS) ×2 IMPLANT
SPONGE LAP 18X18 RF (DISPOSABLE) ×2 IMPLANT
STENT VIABAHN 10X120X8X (Permanent Stent) IMPLANT
STENT VIABAHN 10X50X120 (Permanent Stent) ×2 IMPLANT
STENT VIABAHN 8X100X120 (Permanent Stent) ×3 IMPLANT
STOPCOCK MORSE 400PSI 3WAY (MISCELLANEOUS) ×3 IMPLANT
SYR MEDRAD MARK V 150ML (SYRINGE) IMPLANT
TOWEL GREEN STERILE (TOWEL DISPOSABLE) ×3 IMPLANT
TUBING HIGH PRESSURE 120CM (CONNECTOR) IMPLANT
WATER STERILE IRR 1000ML POUR (IV SOLUTION) ×1 IMPLANT
WIRE HI TORQ VERSACORE J 260CM (WIRE) ×2 IMPLANT

## 2018-09-23 NOTE — Interval H&P Note (Signed)
History and Physical Interval Note:  09/23/2018 7:26 AM  Benjamin Jenkins  has presented today for surgery, with the diagnosis of PERIPHERAL ARTERY DISEASE  The various methods of treatment have been discussed with the patient and family. After consideration of risks, benefits and other options for treatment, the patient has consented to  Procedure(s): INSERTION OF RIGHT POPLITEAL STENT (GORE VIABON) (Right) as a surgical intervention .  The patient's history has been reviewed, patient examined, no change in status, stable for surgery.  I have reviewed the patient's chart and labs.  Questions were answered to the patient's satisfaction.     Durene Cal

## 2018-09-23 NOTE — Op Note (Signed)
Patient name: Benjamin Jenkins MRN: [id number] DOB: May 27, 1956 Sex: male  09/23/2018 Pre-operative Diagnosis: Right popliteal artery aneurysm Post-operative diagnosis:  Same Surgeon:  Durene Cal Procedure Performed:  1.  Ultrasound-guided access, left femoral artery  2.  Right lower extremity runoff  3.  Endovascular repair of right popliteal artery aneurysm using a 8 x 110 x 50 Viabahn stent  4.  Closure device (Perclose)    Indications: The patient has previously undergone bilateral femoral artery aneurysm repair.  He also has a 3 cm right popliteal aneurysm.  He comes in today for endovascular repair.  Procedure:  The patient was identified in the holding area and taken to room 18.  The patient was then placed supine on the table and prepped and draped in the usual sterile fashion.  A time out was called.  Ultrasound was used to evaluate the right common femoral artery.  It was patent .  A digital ultrasound image was acquired.  A micropuncture needle was used to access the right common femoral artery under ultrasound guidance.  An 018 wire was advanced without resistance and a micropuncture sheath was placed.  The 018 wire was removed and a benson wire was placed.  The micropuncture sheath was exchanged for a 5 french sheath.  Right lower extremity arteriogram was then performed  Findings:   Right Lower Extremity: The right common femoral and profundofemoral artery are widely patent.  The superficial femoral artery is widely patent.  There is aneurysmal degeneration of the above-knee popliteal artery.  The below-knee popliteal artery is widely patent with three-vessel runoff.   Intervention: After the above images were acquired the decision was made to proceed with intervention.  Ultrasound was then used to evaluate the left common femoral artery.  The dacryon graft was readily visualized.  A #11 blade was used to make a skin nick.  The left common femoral artery was then cannulated  under ultrasound guidance with a micropuncture needle.  An 018 wire was advanced without resistance followed by placement of a 5 French sheath.  The subcutaneous tract was then dilated with an 8 French dilator and Pro-glide devices were deployed at the 11:00 and 1 o'clock position for pre-closure.  Next, a Bentson wire was used to cross the aortic bifurcation.  A 4 French straight catheter was then inserted and an Amplatz wire was placed.  A 12 French dry seal sheath was then advanced over the aortic bifurcation and positioned into the right external iliac artery.  The patient was fully heparinized.  I then used a versa core wire which easily passed down into the below-knee popliteal artery.  I first deployed a 8 x 100 Viabahn stent beginning at the bone edge extending proximally.  A second 10 x 50 via bond stent was then deployed.  The stents were molded to confirmation with a 9 mm balloon.  Completion imaging was then performed which showed a widely patent stented segment with no change in the runoff.  I then removed the 12 French sheath and secured the arteriotomy closure with the Pro-glide devices.  A second Pro-glide device was used to close the hole in the right femoral artery.  25 mg of protamine was then administered.  Manual pressure was held on the cannulation sites.  Both were hemostatic and covered with Dermabond.  There were no immediate complications.  The patient was taken to recovery in stable condition after he was extubated.  Impression:  #1  Successful stenting of right popliteal  artery aneurysm using a 8 x 100 and a 10 x 50 Viabahn stent    V. Durene Cal, M.D. [facility] Office: [phone] Pager:  [phone]

## 2018-09-23 NOTE — Transfer of Care (Signed)
Immediate Anesthesia Transfer of Care Note  Patient: Benjamin Jenkins  Procedure(s) Performed: INSERTION OF RIGHT POPLITEAL STENT (GORE VIABON) (Right Leg Lower)  Patient Location: PACU  Anesthesia Type:General  Level of Consciousness: awake, alert  and oriented  Airway & Oxygen Therapy: Patient Spontanous Breathing and Patient connected to nasal cannula oxygen  Post-op Assessment: Report given to RN, Post -op Vital signs reviewed and stable and Patient moving all extremities X 4  Post vital signs: Reviewed and stable  Last Vitals:  Vitals Value Taken Time  BP 135/82 09/23/2018  9:42 AM  Temp    Pulse 86 09/23/2018  9:43 AM  Resp 14 09/23/2018  9:43 AM  SpO2 100 % 09/23/2018  9:43 AM  Vitals shown include unvalidated device data.  Last Pain:  Vitals:   09/23/18 0555  TempSrc: Oral         Complications: No apparent anesthesia complications

## 2018-09-23 NOTE — Anesthesia Postprocedure Evaluation (Signed)
Anesthesia Post Note  Patient: Benjamin Jenkins  Procedure(s) Performed: INSERTION OF RIGHT POPLITEAL STENT (GORE VIABON) (Right Leg Lower)     Patient location during evaluation: SICU Anesthesia Type: General Level of consciousness: patient remains intubated per anesthesia plan Pain management: pain level controlled Respiratory status: patient remains intubated per anesthesia plan Cardiovascular status: stable Postop Assessment: no apparent nausea or vomiting Anesthetic complications: no    Last Vitals:  Vitals:   09/23/18 1700 09/23/18 1800  BP: 133/80 128/76  Pulse: 89 82  Resp: 15 18  Temp:    SpO2: 99% 98%    Last Pain:  Vitals:   09/23/18 1800  TempSrc:   PainSc: 0-No pain                 Clide Remmers

## 2018-09-23 NOTE — Anesthesia Procedure Notes (Signed)
Procedure Name: LMA Insertion Date/Time: 09/23/2018 7:45 AM Performed by: Carmela Rima, CRNA Pre-anesthesia Checklist: Timeout performed, Patient being monitored, Suction available, Emergency Drugs available and Patient identified Patient Re-evaluated:Patient Re-evaluated prior to induction Oxygen Delivery Method: Circle system utilized Preoxygenation: Pre-oxygenation with 100% oxygen Induction Type: IV induction Ventilation: Mask ventilation without difficulty LMA: LMA inserted LMA Size: 5.0 Number of attempts: 1 Placement Confirmation: breath sounds checked- equal and bilateral and positive ETCO2 Tube secured with: Tape Dental Injury: Teeth and Oropharynx as per pre-operative assessment

## 2018-09-24 LAB — BASIC METABOLIC PANEL
ANION GAP: 5 (ref 5–15)
BUN: 11 mg/dL (ref 6–20)
CO2: 23 mmol/L (ref 22–32)
Calcium: 8.6 mg/dL — ABNORMAL LOW (ref 8.9–10.3)
Chloride: 111 mmol/L (ref 98–111)
Creatinine, Ser: 0.84 mg/dL (ref 0.61–1.24)
GFR calc Af Amer: >60 mL/min (ref >60)
GLUCOSE: 139 mg/dL — AB (ref 70–99)
Potassium: 3.8 mmol/L (ref 3.5–5.1)
Sodium: 139 mmol/L (ref 135–145)

## 2018-09-24 LAB — CBC
HCT: 37 % — ABNORMAL LOW (ref 39.0–52.0)
Hemoglobin: 12.5 g/dL — ABNORMAL LOW (ref 13.0–17.0)
MCH: 32.2 pg (ref 26.0–34.0)
MCHC: 33.8 g/dL (ref 30.0–36.0)
MCV: 95.4 fL (ref 80.0–100.0)
Platelets: 236 10*3/uL (ref 150–400)
RBC: 3.88 MIL/uL — ABNORMAL LOW (ref 4.22–5.81)
RDW: 12.6 % (ref 11.5–15.5)
WBC: 13.2 10*3/uL — ABNORMAL HIGH (ref 4.0–10.5)
nRBC: 0 % (ref 0.0–0.2)

## 2018-09-24 MED ORDER — CLOPIDOGREL BISULFATE 75 MG PO TABS: 75.0000 mg | ORAL_TABLET | Freq: Every day | ORAL | 11 refills | Status: DC

## 2018-09-24 MED ORDER — ASPIRIN 81 MG PO TBEC: 81.0000 mg | DELAYED_RELEASE_TABLET | Freq: Every day | ORAL | Status: AC

## 2018-09-24 MED ORDER — OXYCODONE HCL 5 MG PO TABS: 5.0000 mg | ORAL_TABLET | Freq: Four times a day (QID) | ORAL | 0 refills | Status: DC | PRN

## 2018-09-24 NOTE — Plan of Care (Signed)
Plan of care initiated and progressing 

## 2018-09-24 NOTE — Discharge Instructions (Signed)
° °  [facility] ° °Discharge Instructions ° °Lower Extremity Angiogram; Angioplasty/Stenting ° °Please refer to the following instructions for your post-procedure care. Your surgeon or physician assistant will discuss any changes with you. ° °Activity ° °Avoid lifting more than 8 pounds (1 gallons of milk) for 72 hours (3 days) after your procedure. You may walk as much as you can tolerate. It's OK to drive after 72 hours. ° °Bathing/Showering ° °You may shower the day after your procedure. If you have a bandage, you may remove it at 24- 48 hours. Clean your incision site with mild soap and water. Pat the area dry with a clean towel. ° °Diet ° °Resume your pre-procedure diet. There are no special food restrictions following this procedure. All patients with peripheral vascular disease should follow a low fat/low cholesterol diet. In order to heal from your surgery, it is CRITICAL to get adequate nutrition. Your body requires vitamins, minerals, and protein. Vegetables are the best source of vitamins and minerals. Vegetables also provide the perfect balance of protein. Processed food has little nutritional value, so try to avoid this. ° °Medications ° °Resume taking all of your medications unless your doctor tells you not to. If your incision is causing pain, you may take over-the-counter pain relievers such as acetaminophen (Tylenol) ° °Follow Up ° °Follow up will be arranged at the time of your procedure. You may have an office visit scheduled or may be scheduled for surgery. Ask your surgeon if you have any questions. ° °Please call us immediately for any of the following conditions: °•Severe or worsening pain your legs or feet at rest or with walking. °•Increased pain, redness, drainage at your groin puncture site. °•Fever of 101 degrees or higher. °•If you have any mild or slow bleeding from your puncture site: lie down, apply firm constant pressure over the area with a piece of  gauze or a clean wash cloth for 30 minutes- no peeking!, call 911 right away if you are still bleeding after 30 minutes, or if the bleeding is heavy and unmanageable. ° °Reduce your risk factors of vascular disease: ° °Stop smoking. If you would like help call [facility] at 1-800-QUIT-NOW (1-800-784-8669) or [place] at 336-586-4000. °Manage your cholesterol °Maintain a desired weight °Control your diabetes °Keep your blood pressure down ° °If you have any questions, please call the office at [phone] ° °

## 2018-09-24 NOTE — Discharge Summary (Signed)
Discharge Summary    Benjamin Jenkins 1956/05/17 63 y.o. male  [id number]  Admission Date: 09/23/2018  Discharge Date: 09/24/2018  Physician: Nada Libman, MD  Admission Diagnosis: PERIPHERAL ARTERY DISEASE   HPI:   This is a 63 y.o. male who is s/p Open repair of bilateral femoral aneurysms with interposition graft between the bilateral commonfemoral and superficial femoral arteries,and re-implantation of bilateral profunda femoral arterieson 01/06/18.  He continues to have significant issues with his activity level. He complains of bilateral leg pain and weakness in addition to his abdominal pain and back pain. This is significantly interfering with his ability to work.  I sent him for CT scan to evaluate whether or not he is a candidate for popliteal aneurysm repair via stenting.  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 He is medically managed for hypercholesterolemia with a statin. He is followed by Dr. Belva Chimes his coronary artery disease  Hospital Course:  The patient was admitted to the hospital and taken to the operating room on 09/23/2018 and underwent: 1. Ultrasound-guided access, left femoral artery 2. Right lower extremity runoff 3. Endovascular repair of right popliteal artery aneurysm using a 8 x 110 x 50Viabahnstent 4. Closure device (Perclose)    Findings:              Right Lower Extremity: The right common femoral and profundofemoral artery are widely patent.  The superficial femoral artery is widely patent.  There is aneurysmal degeneration of the above-knee popliteal artery.  The below-knee popliteal artery is widely patent with three-vessel runoff.  The pt tolerated the procedure well and was transported to the PACU in good condition.   By POD 1, he was doing well with palpable DP/PT pulses on the right.  He did have some tenderness in the left groin but no hematoma (most likely scar  tissue from previous surgery).  Pt able to walk without difficulty.  Dr. Chestine Spore examined pt and felt his groin felt ok.  If pain increases, will get u/s, otherwise, discharge home today.  The remainder of the hospital course consisted of increasing mobilization and increasing intake of solids without difficulty.  CBC    Component Value Date/Time   WBC 13.2 (H) 09/24/2018 0340   RBC 3.88 (L) 09/24/2018 0340   HGB 12.5 (L) 09/24/2018 0340   HGB 15.0 02/18/2017 0000   HCT 37.0 (L) 09/24/2018 0340   HCT 43.3 02/18/2017 0000   PLT 236 09/24/2018 0340   PLT 294 02/18/2017 0000   MCV 95.4 09/24/2018 0340   MCV 98 (H) 02/18/2017 0000   MCH 32.2 09/24/2018 0340   MCHC 33.8 09/24/2018 0340   RDW 12.6 09/24/2018 0340   RDW 13.6 02/18/2017 0000   LYMPHSABS 1.9 02/18/2017 0000   EOSABS 0.2 02/18/2017 0000   BASOSABS 0.1 02/18/2017 0000    BMET    Component Value Date/Time   NA 139 09/24/2018 0340   NA 140 02/18/2017 0000   K 3.8 09/24/2018 0340   CL 111 09/24/2018 0340   CO2 23 09/24/2018 0340   GLUCOSE 139 (H) 09/24/2018 0340   BUN 11 09/24/2018 0340   BUN 16 02/18/2017 0000   CREATININE 0.84 09/24/2018 0340   CALCIUM 8.6 (L) 09/24/2018 0340   GFRNONAA >60 09/24/2018 0340   GFRAA >60 09/24/2018 0340      Discharge Instructions    Discharge patient   Complete by:  As directed    Discharge disposition:  01-Home or Self Care   Discharge patient date:  09/24/2018      Discharge Diagnosis:  PERIPHERAL ARTERY DISEASE  Secondary Diagnosis: Patient Active Problem List   Diagnosis Date Noted  . Popliteal aneurysm (HCC) 09/23/2018  . 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 CAD (coronary artery disease) 02/17/2017  . COPD with chronic bronchitis and emphysema (HCC) 02/08/2017  . Tobacco abuse 02/08/2017   Past Medical History:  Diagnosis Date  . Anxiety   . Arthritis   . Concussion    in high school  . COPD (chronic obstructive pulmonary disease) (HCC)   .  Depression   . Dyspnea   . Headache    hx. migraines,sinus headaches  . Peripheral vascular disease (HCC)   . Vertigo      Allergies as of 09/24/2018   No Known Allergies     Medication List    TAKE these medications   aspirin 81 MG EC tablet Take 1 tablet (81 mg total) by mouth daily. What changed:    medication strength  how much to take   atorvastatin 10 MG tablet Commonly known as:  Lipitor Take 1 tablet (10 mg total) by mouth daily.   Atrovent HFA 17 MCG/ACT inhaler Generic drug:  ipratropium TAKE 2 PUFFS BY MOUTH EVERY 6 HOURS AS NEEDED FOR WHEEZE What changed:  See the new instructions.   clopidogrel 75 MG tablet Commonly known as:  PLAVIX Take 1 tablet (75 mg total) by mouth daily with breakfast.   gabapentin 300 MG capsule Commonly known as:  NEURONTIN Take 1 capsule (300 mg total) by mouth at bedtime.   Lubricant Eye Drops 0.4-0.3 % Soln Generic drug:  Polyethyl Glycol-Propyl Glycol Place 1 drop into both eyes 3 (three) times daily as needed (for floaters/dry/irritated eyes.).   multivitamin with minerals Tabs tablet Take 1 tablet by mouth daily.   oxyCODONE 5 MG immediate release tablet Commonly known as:  Oxy IR/ROXICODONE Take 1 tablet (5 mg total) by mouth every 6 (six) hours as needed for moderate pain.   sertraline 50 MG tablet Commonly known as:  ZOLOFT Take 50 mg by mouth every evening.   Vitamin D-3 125 MCG (5000 UT) Tabs Take 5,000 Units by mouth daily.       Prescriptions given: 1.  Roxicodone #6 No Refill 2.  Plavix 75mg  daily #30 six refills 3.  Aspirin changed to 81mg  daily from 325mg  daily  Instructions:   [facility]  Discharge Instructions  Lower Extremity Angiogram; Angioplasty/Stenting  Please refer to the following instructions for your post-procedure care. Your surgeon or physician assistant will discuss any changes with you.  Activity  Avoid lifting more than 8 pounds (1  gallons of milk) for 72 hours (3 days) after your procedure. You may walk as much as you can tolerate. It's OK to drive after 72 hours.  Bathing/Showering  You may shower the day after your procedure. If you have a bandage, you may remove it at 24- 48 hours. Clean your incision site with mild soap and water. Pat the area dry with a clean towel.  Diet  Resume your pre-procedure diet. There are no special food restrictions following this procedure. All patients with peripheral vascular disease should follow a low fat/low cholesterol diet. In order to heal from your surgery, it is CRITICAL to get adequate nutrition. Your body requires vitamins, minerals, and protein. Vegetables are the best source of vitamins and minerals. Vegetables also provide the perfect  balance of protein. Processed food has little nutritional value, so try to avoid this.  Medications  Resume taking all of your medications unless your doctor tells you not to. If your incision is causing pain, you may take over-the-counter pain relievers such as acetaminophen (Tylenol)  Follow Up  Follow up will be arranged at the time of your procedure. You may have an office visit scheduled or may be scheduled for surgery. Ask your surgeon if you have any questions.  Please call us immediately for any of the following conditions: .Severe or worsening pain your legs or feet at rest or with walking. .Increased pain, redness, drainage at your groin puncture site. .Fever of 101 degrees or higher. .If you have any mild or slow bleeding from your puncture site: lie down, apply firm constant pressure over the area with a piece of gauze or a clean wash cloth for 30 minutes- no peeking!, call 911 right away if you are still bleeding after 30 minutes, or if the bleeding is heavy and unmanageable.  Reduce your risk factors of vascular disease:  . Stop smoking. If you would like help call [facility] at 1-800-QUIT-NOW (251 253 9862) or [place]  at [phone]. . Manage your cholesterol . Maintain a desired weight . Control your diabetes . Keep your blood pressure down .  If you have any questions, please call the office at [phone]    Disposition: home  Patient's condition: is Good  Follow up: 1. Dr. Myra Gianotti in 4 weeks with arterial duplex   Doreatha Massed, PA-C [facility] [phone] 09/24/2018  9:07 AM

## 2018-09-24 NOTE — Progress Notes (Signed)
Discharged to home with family office visits in place teaching done  

## 2018-09-24 NOTE — Progress Notes (Addendum)
  Progress Note    09/24/2018 8:12 AM 1 Day Post-Op  Subjective:  C/o some pain in left groin; says he has walked in the hallway.  Afebrile HR  50's-80's NSR 110's-140's systolic 97% RA  Vitals:   09/24/18 0530 09/24/18 0600  BP: 108/75 (!) 143/96  Pulse: (!) 56 (!) 151  Resp: 13 16  Temp: 98.2 F (36.8 C)   SpO2: 97% (!) 82%    Physical Exam: Cardiac:  regular Lungs:  Non labored Incisions:  Left groin with probable scar tissue proximal to stick site, otherwise soft.  Mild tenderness to palpation.  Extremities:  Palpable right DP/PT pulses   CBC    Component Value Date/Time   WBC 13.2 (H) 09/24/2018 0340   RBC 3.88 (L) 09/24/2018 0340   HGB 12.5 (L) 09/24/2018 0340   HGB 15.0 02/18/2017 0000   HCT 37.0 (L) 09/24/2018 0340   HCT 43.3 02/18/2017 0000   PLT 236 09/24/2018 0340   PLT 294 02/18/2017 0000   MCV 95.4 09/24/2018 0340   MCV 98 (H) 02/18/2017 0000   MCH 32.2 09/24/2018 0340   MCHC 33.8 09/24/2018 0340   RDW 12.6 09/24/2018 0340   RDW 13.6 02/18/2017 0000   LYMPHSABS 1.9 02/18/2017 0000   EOSABS 0.2 02/18/2017 0000   BASOSABS 0.1 02/18/2017 0000    BMET    Component Value Date/Time   NA 139 09/24/2018 0340   NA 140 02/18/2017 0000   K 3.8 09/24/2018 0340   CL 111 09/24/2018 0340   CO2 23 09/24/2018 0340   GLUCOSE 139 (H) 09/24/2018 0340   BUN 11 09/24/2018 0340   BUN 16 02/18/2017 0000   CREATININE 0.84 09/24/2018 0340   CALCIUM 8.6 (L) 09/24/2018 0340   GFRNONAA >60 09/24/2018 0340   GFRAA >60 09/24/2018 0340    INR    Component Value Date/Time   INR 0.99 12/31/2017 1423     Intake/Output Summary (Last 24 hours) at 09/24/2018 1155 Last data filed at 09/24/2018 0550 Gross per 24 hour  Intake 3056.99 ml  Output 3200 ml  Net -143.01 ml     Assessment:  63 y.o. male is s/p:  1.  Ultrasound-guided access, left femoral artery 2.  Right lower extremity runoff 3.  Endovascular repair of right popliteal artery aneurysm using a 8 x 110  x 50 Viabahn stent 4.  Closure device (Perclose)   1 Day Post-Op  Plan: -pt with probable scar tissue proximal to left groin stick but otherwise, groin is soft.  Pt tender in left groin.  Dr. Chestine Spore examined pt and groin feels ok, if this worsens, may need u/s -pt with palpable DP/PT pulses right foot.   -pt has walked in the hallways and been up this am without problems -will d/c on Plavix; continue asa/statin   Doreatha Massed, PA-C [facility] [phone] 09/24/2018 8:12 AM   I have seen and evaluated the patient. I agree with the PA note as documented above. POD#1 s/p right popliteal aneurysm repair with Viabahn.  Palpable right pedal pulses.  Left groin c/d/i.  Will plan for d/c home today on aspirin/Plavix.  Follow-up one month with arterial duplex right leg.  Cephus Shelling, MD [facility] [place] Office: [phone] Pager: [phone]

## 2018-09-26 ENCOUNTER — Encounter (HOSPITAL_COMMUNITY): Payer: Self-pay | Admitting: Surgery

## 2018-10-20 ENCOUNTER — Other Ambulatory Visit: Payer: Self-pay | Admitting: Family

## 2018-11-11 ENCOUNTER — Other Ambulatory Visit: Payer: Self-pay | Admitting: *Deleted

## 2018-11-11 DIAGNOSIS — I724 Aneurysm of artery of lower extremity: Secondary | ICD-10-CM

## 2018-11-13 ENCOUNTER — Other Ambulatory Visit: Payer: Self-pay | Admitting: Family

## 2018-11-21 ENCOUNTER — Encounter: Payer: 59 | Admitting: Surgery

## 2018-11-21 ENCOUNTER — Encounter (HOSPITAL_COMMUNITY): Payer: 59

## 2018-11-28 ENCOUNTER — Other Ambulatory Visit: Payer: Self-pay

## 2018-11-28 ENCOUNTER — Encounter: Payer: Self-pay | Admitting: Surgery

## 2018-11-28 ENCOUNTER — Ambulatory Visit (HOSPITAL_COMMUNITY)
Admission: RE | Admit: 2018-11-28 | Discharge: 2018-11-28 | Disposition: A | Discharge status: Home / Self Care | Payer: 59 | Source: Ambulatory Visit | Attending: Surgery | Admitting: Surgery

## 2018-11-28 ENCOUNTER — Ambulatory Visit (INDEPENDENT_AMBULATORY_CARE_PROVIDER_SITE_OTHER): Payer: 59 | Admitting: Surgery

## 2018-11-28 ENCOUNTER — Ambulatory Visit (INDEPENDENT_AMBULATORY_CARE_PROVIDER_SITE_OTHER)
Admission: RE | Admit: 2018-11-28 | Discharge: 2018-11-28 | Disposition: A | Discharge status: Home / Self Care | Payer: 59 | Source: Ambulatory Visit | Attending: Surgery | Admitting: Surgery

## 2018-11-28 VITALS — BP 138/90 | HR 83 | Temp 97.9°F | Resp 20 | Ht 69.0 in | Wt 133.6 lb

## 2018-11-28 DIAGNOSIS — I724 Aneurysm of artery of lower extremity: Secondary | ICD-10-CM

## 2018-11-28 NOTE — Progress Notes (Signed)
[facility] [place]  Patient name: Benjamin Jenkins MRN: [id number] DOB: 1956-07-01 Sex: male   REASON FOR VISIT:    Follow up  HISOTRY OF PRESENT ILLNESS:   This is a 63 y.o.malewho is s/p Open repair of bilateral femoral aneurysms with interposition graft between the bilateral commonfemoral and superficial femoral arteries,and re-implantation of bilateral profunda femoral arterieson 01/06/18. He is still having neuropathic type pain in his left thigh and soreness in both groins.  He underwent right popliteal aneurysm repair with Viabahn stents on3-12-2018.    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 He is medically managed for hypercholesterolemia with a statin. He is followed by Dr. Belva ChimesGanjifor his coronary artery disease    PAST MEDICAL HISTORY:   Past Medical History:  Diagnosis Date  . Anxiety   . Arthritis   . Concussion    in high school  . COPD (chronic obstructive pulmonary disease) (HCC)   . Depression   . Dyspnea   . Headache    hx. migraines,sinus headaches  . Peripheral vascular disease (HCC)   . Vertigo      FAMILY HISTORY:   Family History  Problem Relation Age of Onset  . Heart disease Mother     SOCIAL HISTORY:   Social History   Tobacco Use  . Smoking status: Former Smoker    Packs/day: 0.50    Years: 20.00    Pack years: 10.00    Types: Cigarettes    Last attempt to quit: 11/07/2017    Years since quitting: 1.0  . Smokeless tobacco: Never Used  . Tobacco comment: has stopped in last couple weeks  Substance Use Topics  . Alcohol use: Yes    Alcohol/week: 28.0 standard drinks    Types: 28 Cans of beer per week    Comment: 09/20/2018- none in a couple months     ALLERGIES:   No Known Allergies   CURRENT MEDICATIONS:   Current Outpatient Medications  Medication Sig Dispense Refill  . aspirin EC 81 MG EC tablet Take 1 tablet (81 mg total) by mouth  daily.    Marland Kitchen. atorvastatin (LIPITOR) 10 MG tablet Take 1 tablet (10 mg total) by mouth daily. 30 tablet 11  . ATROVENT HFA 17 MCG/ACT inhaler TAKE 2 PUFFS BY MOUTH EVERY 6 HOURS AS NEEDED FOR WHEEZE (Patient taking differently: Inhale 2 puffs into the lungs every 6 (six) hours as needed for wheezing. ) 12.9 Inhaler 0  . Cholecalciferol (VITAMIN D-3) 5000 units TABS Take 5,000 Units by mouth daily.    . clopidogrel (PLAVIX) 75 MG tablet Take 1 tablet (75 mg total) by mouth daily with breakfast. 30 tablet 11  . gabapentin (NEURONTIN) 300 MG capsule TAKE 1 CAPSULE BY MOUTH EVERYDAY AT BEDTIME 30 capsule 6  . Multiple Vitamin (MULTIVITAMIN WITH MINERALS) TABS tablet Take 1 tablet by mouth daily.    . sertraline (ZOLOFT) 50 MG tablet Take 50 mg by mouth every evening.     Bertram Gala. Polyethyl Glycol-Propyl Glycol (LUBRICANT EYE DROPS) 0.4-0.3 % SOLN Place 1 drop into both eyes 3 (three) times daily as needed (for floaters/dry/irritated eyes.).     No current facility-administered medications for this visit.     REVIEW OF SYSTEMS:   [X]  denotes positive finding, [ ]  denotes negative finding Cardiac  Comments:  Chest pain or chest pressure:    Shortness of breath upon exertion:    Short of breath when lying flat:    Irregular heart rhythm:  Vascular    Pain in calf, thigh, or hip brought on by ambulation:    Pain in feet at night that wakes you up from your sleep:     Blood clot in your veins:    Leg swelling:         Pulmonary    Oxygen at home:    Productive cough:     Wheezing:         Neurologic    Sudden weakness in arms or legs:     Sudden numbness in arms or legs:     Sudden onset of difficulty speaking or slurred speech:    Temporary loss of vision in one eye:     Problems with dizziness:         Gastrointestinal    Blood in stool:     Vomited blood:         Genitourinary    Burning when urinating:     Blood in urine:        Psychiatric    Major depression:          Hematologic    Bleeding problems:    Problems with blood clotting too easily:        Skin    Rashes or ulcers:        Constitutional    Fever or chills:      PHYSICAL EXAM:   There were no vitals filed for this visit.  GENERAL: The patient is a well-nourished male, in no acute distress. The vital signs are documented above. CARDIAC: There is a regular rate and rhythm.  VASCULAR: Palpable pedal pulses bilaterally.  Palpable popliteal pulses bilaterally. PULMONARY: Non-labored respirations MUSCULOSKELETAL: There are no major deformities or cyanosis. NEUROLOGIC: No focal weakness or paresthesias are detected. SKIN: There are no ulcers or rashes noted. PSYCHIATRIC: The patient has a normal affect.  STUDIES:   I have ordered and reviewed his vascular lab studies with the following findings:  Right: Right popliteal artery stent does not appear to adhere to the vessel wall. Retrograde flow from the distal stent end is visualized to the aneurysm. The aneurysm measures approximately 3.33 cm in diameter which is a slight increase from the pre-op diameter.  +-------+-----------+-----------+------------+------------+ ABI/TBIToday's ABIToday's TBIPrevious ABIPrevious TBI +-------+-----------+-----------+------------+------------+ Right  1.26       0.71       1.25        0.59         +-------+-----------+-----------+------------+------------+ Left   1.26       0.74       1.25        0.67         MEDICAL ISSUES:   Bilateral femoral artery aneurysm repair: The patient's incisions have healed however he still continues to have neuropathic pain.  I suspect this is related to scar tissue.  I have encouraged him to be as active as possible.  Right popliteal aneurysm: The stent is no longer adherent to the popliteal wall at the distal attachment site.  There is retrograde filling into the aneurysm.  I told her that this needs to be addressed.  This will most likely require  stenting across the popliteal space into the below-knee popliteal artery.  He will most likely spend the night after the procedure which will be done in the operating room.  He will need at least 2 weeks of recovery away from work after his stent placement.    Charlena Cross, MD, FACS [facility] [phone] Pager [phone]

## 2018-12-06 ENCOUNTER — Telehealth: Payer: Self-pay | Admitting: *Deleted

## 2018-12-06 NOTE — Telephone Encounter (Signed)
Left phone message for patient to call this office back to schedule surgery with Dr. Myra Gianotti.

## 2018-12-26 ENCOUNTER — Telehealth: Payer: Self-pay | Admitting: *Deleted

## 2018-12-26 NOTE — Telephone Encounter (Signed)
Call from patient. He stated he has not called back because he is unsure of what to do about scheduling surgery because he lost his job last week. I gave him number for [facility] financial counselors. He will call me back when ready to schedule surgery.

## 2018-12-30 ENCOUNTER — Telehealth: Payer: Self-pay | Admitting: Licensed Clinical Social Worker

## 2018-12-30 NOTE — Telephone Encounter (Signed)
Patient called to share that he has hit "rough times again". Patient is unable to return to work due to ongoing physical issues and reports his short term disability has stopped. Patient states he is in a financial bind and looking for some resources. CSW referred patient to Social Services , Teaching laboratory technician for assistance. Patient verbalizes understanding and will follow up with resources provided. CSW available as needed. Raquel Sarna, [place], [place]

## 2019-10-24 ENCOUNTER — Other Ambulatory Visit: Payer: Self-pay | Admitting: Physician Assistant

## 2019-11-14 ENCOUNTER — Telehealth: Payer: Self-pay | Admitting: Cardiology

## 2019-11-14 NOTE — Telephone Encounter (Signed)
Next available with anybody is fine

## 2019-11-14 NOTE — Telephone Encounter (Signed)
Patient needs surgical clearance appointment. He see's Dr. Tomie China in [place] but Dr. Tomie China does not have anything in [place] until 01/15/20. He states this needs to be done before June. He does not want to travel to [place]. Please advise. If he does not answer please leave a VM.

## 2019-11-16 ENCOUNTER — Telehealth: Payer: Self-pay | Admitting: Cardiology

## 2019-11-16 NOTE — Telephone Encounter (Signed)
Called patient. Informed him that Dr. Bing Matter is out this week and I will have to consult with him about this on Monday. He verbally understood.

## 2019-11-16 NOTE — Telephone Encounter (Signed)
Patient has an appt with Dr. Bing Matter on 11/27/19 for preop clearance. He states that his PCP is wanting to have lab work done but if Dr. Bing Matter is going to have lab work done at his [facility] then he just wants to have those results faxed to his PCP. He states that the labs he needs to get done are a Lipid Panel and Complete Metabolic Panel. Please advise. He is not wanting to have lab done at both places.

## 2019-11-20 NOTE — Telephone Encounter (Signed)
If we did not have any recent lipid panel as well as complete metabolic panel done by primary care physician we will do it today when he will be here next time

## 2019-11-22 NOTE — Telephone Encounter (Signed)
Called patient and let him know that we can draw labs at next appointment if needed. He verbally understood no further questions.

## 2019-11-26 ENCOUNTER — Other Ambulatory Visit: Payer: Self-pay | Admitting: Vascular Surgery

## 2019-11-27 ENCOUNTER — Other Ambulatory Visit: Payer: Self-pay

## 2019-11-27 ENCOUNTER — Encounter: Payer: Self-pay | Admitting: Cardiology

## 2019-11-27 ENCOUNTER — Ambulatory Visit (INDEPENDENT_AMBULATORY_CARE_PROVIDER_SITE_OTHER): Payer: Self-pay | Admitting: Cardiology

## 2019-11-27 VITALS — BP 122/78 | HR 70 | Ht 69.0 in | Wt 142.0 lb

## 2019-11-27 DIAGNOSIS — J449 Chronic obstructive pulmonary disease, unspecified: Secondary | ICD-10-CM

## 2019-11-27 DIAGNOSIS — Z720 Tobacco use: Secondary | ICD-10-CM

## 2019-11-27 DIAGNOSIS — Z01810 Encounter for preprocedural cardiovascular examination: Secondary | ICD-10-CM | POA: Insufficient documentation

## 2019-11-27 DIAGNOSIS — I724 Aneurysm of artery of lower extremity: Secondary | ICD-10-CM

## 2019-11-27 DIAGNOSIS — I2510 Atherosclerotic heart disease of native coronary artery without angina pectoris: Secondary | ICD-10-CM

## 2019-11-27 MED ORDER — METOPROLOL TARTRATE 100 MG PO TABS: 100.0000 mg | ORAL_TABLET | Freq: Once | ORAL | 0 refills | Status: DC

## 2019-11-27 NOTE — Progress Notes (Signed)
Cardiology Office Note:    Date:  11/27/2019   ID:  Benjamin Jenkins, DOB 1955-09-08, MRN [id number]  PCP:  Clayborn Heron, MD  Cardiologist:  Gypsy Balsam, MD    Referring MD: Clayborn Heron, MD   Chief Complaint  Patient presents with  . Pre-op Exam    History of Present Illness:    Benjamin Jenkins is a 64 y.o. male complex past medical history which include peripheral vascular disease, status post repair of bilateral femoral aneurysm with interposition graft between bilateral common femoral and superficial femoral arteries and reimplantation of bilateral profunda femoral arteries that was done in June 2019, also some history of right popliteal aneurysm that was with her with stenting done on 09/23/2018.  He was evaluated for coronary artery disease in 2018, stress test was done which showed some abnormality, cardiac catheterization after that however did not show any significant obstructive disease.  Sadly he still continues to smoke very few cigarettes, he was taking cholesterol medication but stopped for few months because of lack of insurance.  He was also discovered to have umbilical hernia and he is being referred to [place] for evaluation before the surgery from cardiac standpoint review.  Overall he said he is able to walk climb stairs but very poorly he does have chronic pain in his legs and that would prevent him from continuation of his walking.  Denies having a chest pain tightness squeezing pressure burning chest.  Past Medical History:  Diagnosis Date  . Anxiety   . Arthritis   . Concussion    in high school  . COPD (chronic obstructive pulmonary disease) (HCC)   . Depression   . Dyspnea   . Headache    hx. migraines,sinus headaches  . Peripheral vascular disease (HCC)   . Vertigo     Past Surgical History:  Procedure Laterality Date  . arm surgery Right in teens   torn muscle  . arthroscopic knee Bilateral 1990's  . BACK SURGERY     L5 disectomy  .  FEMORAL ARTERY ANEURYSM REPAIR Bilateral 01/07/2018  . FEMORAL ARTERY EXPLORATION Bilateral 01/06/2018   Procedure: BILATERAL FEMORAL ARTERY ANEURYSM REPAIR;  Surgeon: Nada Libman, MD;  Location: [facility] OR;  Service: Vascular;  Laterality: Bilateral;  . INSERTION OF ILIAC STENT Right 09/23/2018   Procedure: INSERTION OF RIGHT POPLITEAL STENT (GORE VIABON);  Surgeon: Nada Libman, MD;  Location: [facility] OR;  Service: Vascular;  Laterality: Right;  . LEFT HEART CATH AND CORONARY ANGIOGRAPHY N/A 02/25/2017   Procedure: LEFT HEART CATH AND CORONARY ANGIOGRAPHY;  Surgeon: Lennette Bihari, MD;  Location: [facility] INVASIVE CV LAB;  Service: Cardiovascular;  Laterality: N/A;    Current Medications: Current Meds  Medication Sig  . aspirin EC 81 MG EC tablet Take 1 tablet (81 mg total) by mouth daily.  . clopidogrel (PLAVIX) 75 MG tablet Take 1 tablet by mouth once daily with breakfast     Allergies:   Patient has no known allergies.   Social History   Socioeconomic History  . Marital status: Single    Spouse name: Not on file  . Number of children: Not on file  . Years of education: Not on file  . Highest education level: Not on file  Occupational History  . Not on file  Tobacco Use  . Smoking status: Former Smoker    Packs/day: 0.50    Years: 20.00    Pack years: 10.00    Types: Cigarettes  Quit date: 11/07/2017    Years since quitting: 2.0  . Smokeless tobacco: Never Used  . Tobacco comment: has stopped in last couple weeks  Substance and Sexual Activity  . Alcohol use: Yes    Alcohol/week: 28.0 standard drinks    Types: 28 Cans of beer per week    Comment: 09/20/2018- none in a couple months  . Drug use: Yes    Types: Marijuana    Comment: none in several months- 09/20/2018  . Sexual activity: Not on file  Other Topics Concern  . Not on file  Social History Narrative  . Not on file   Social Determinants of Health   Financial Resource Strain:   . Difficulty of Paying Living  Expenses:   Food Insecurity:   . Worried About Charity fundraiser in the Last Year:   . Arboriculturist in the Last Year:   Transportation Needs:   . Film/video editor (Medical):   Marland Kitchen Lack of Transportation (Non-Medical):   Physical Activity:   . Days of Exercise per Week:   . Minutes of Exercise per Session:   Stress:   . Feeling of Stress :   Social Connections:   . Frequency of Communication with Friends and Family:   . Frequency of Social Gatherings with Friends and Family:   . Attends Religious Services:   . Active Member of Clubs or Organizations:   . Attends Archivist Meetings:   Marland Kitchen Marital Status:      Family History: The patient's family history includes Heart disease in his mother. ROS:   Please see the history of present illness.    All 14 point review of systems negative except as described per history of present illness  EKGs/Labs/Other Studies Reviewed:      Recent Labs: No results found for requested labs within last 8760 hours.  Recent Lipid Panel    Component Value Date/Time   CHOL 150 02/18/2017 0000   TRIG 52 02/18/2017 0000   HDL 63 02/18/2017 0000   CHOLHDL 2.4 02/18/2017 0000   LDLCALC 77 02/18/2017 0000    Physical Exam:    VS:  BP 122/78   Pulse 70   Ht 5\' 9"  (1.753 m)   Wt 142 lb (64.4 kg)   SpO2 99%   BMI 20.97 kg/m     Wt Readings from Last 3 Encounters:  11/27/19 142 lb (64.4 kg)  11/28/18 133 lb 9.6 oz (60.6 kg)  09/23/18 137 lb 12.6 oz (62.5 kg)     GEN:  Well nourished, well developed in no acute distress HEENT: Normal NECK: No JVD; No carotid bruits LYMPHATICS: No lymphadenopathy CARDIAC: RRR, no murmurs, no rubs, no gallops RESPIRATORY:  Clear to auscultation without rales, wheezing or rhonchi  ABDOMEN: Soft, non-tender, non-distended MUSCULOSKELETAL:  No edema; No deformity  SKIN: Warm and dry LOWER EXTREMITIES: no swelling, he got very good dorsalis pedis and posterior tibial on the right side on the  left side posterior tibial is weak but dorsalis pedis is good. NEUROLOGIC:  Alert and oriented x 3 PSYCHIATRIC:  Normal affect   ASSESSMENT:    1. Femoral artery aneurysm, bilateral ([place])   2. 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 Tobacco abuse   4. COPD with chronic bronchitis and emphysema ([place])   5. Coronary artery disease involving native coronary artery of native heart without angina pectoris   6. Preop cardiovascular exam    PLAN:    In order of problems  listed above:  1. Preop evaluation for umbilical hernia surgery.  Because of multiple risk factors for coronary artery disease I will refer him for coronary angiogram.  I prefer to avoid stress testing since previously stress testing showed falsely abnormal results.  The biggest obstacle that I see is potential financial restraint.  Will try to help him the best way we can.  He is already on dual antiplatelet therapy which I will continue.  He is already initiated statin. 2. Advanced peripheral vascular disease with femoral artery aneurysm as well as popliteal aneurysm.  That being followed by vascular surgeon.  The key is risk factor modifications.  I did talk to him about smoking he understands a problem will try to quit.  He is also starting statin he need to be on high intensity statin.  We will check his liver function test and then decide about therapy. 3. Tobacco abuse he said he smokes only a few cigarettes rarely but I told him the ultimate goal is completely discontinue. 4. COPD related to smoking he understands a problem quitting smoking is the key. 5. Coronary artery need to be evaluated.  Last cardiac catheterization showed nonobstructive lesions.  Since he have multiple risk factors that are not modified meaning he still continues to smoke, lack of medication for few months I think we must evaluate him before encountering surgery.  The best way to evaluate it in my opinion is to do coronary CT angio.  We will try to do that.    Medication Adjustments/Labs and Tests Ordered: Current medicines are reviewed at length with the patient today.  Concerns regarding medicines are outlined above.  Orders Placed This Encounter  Procedures  . CT CORONARY MORPH W/CTA COR W/SCORE W/CA W/CM &/OR WO/CM  . CT CORONARY FRACTIONAL FLOW RESERVE DATA PREP  . CT CORONARY FRACTIONAL FLOW RESERVE FLUID ANALYSIS  . Comprehensive Metabolic Panel (CMET)  . EKG 12-Lead   Medication changes:  Meds ordered this encounter  Medications  . metoprolol tartrate (LOPRESSOR) 100 MG tablet    Sig: Take 1 tablet (100 mg total) by mouth once for 1 dose. 2 hours before CT    Dispense:  1 tablet    Refill:  0    Signed, Georgeanna Lea, MD, [facility] 11/27/2019 11:55 AM    [facility]

## 2019-11-27 NOTE — Patient Instructions (Signed)
Medication Instructions:  Your physician recommends that you continue on your current medications as directed. Please refer to the Current Medication list given to you today.  *If you need a refill on your cardiac medications before your next appointment, please call your pharmacy*   Lab Work: Your physician recommends that you return for lab work today: [facility]  Your physician recommends that you return for lab work 3-7 days before scheduled ct: BMP   If you have labs (blood work) drawn today and your tests are completely normal, you will receive your results only by: Marland Kitchen MyChart Message (if you have MyChart) OR . A paper copy in the mail If you have any lab test that is abnormal or we need to change your treatment, we will call you to review the results.   Testing/Procedures: Your cardiac CT will be scheduled at one of the below locations:   [facility] [address] [place], [place] [address] [phone]  OR  [facility] [address] [place], [place] [address] [phone]  If scheduled at [facility], please arrive at the [facility] main entrance of [facility] 30 minutes prior to test start time. Proceed to the [facility] Radiology Department (first floor) to check-in and test prep.  If scheduled at [facility], please arrive 15 mins early for check-in and test prep.  Please follow these instructions carefully (unless otherwise directed):  Hold all erectile dysfunction medications at least 3 days (72 hrs) prior to test.  On the Night Before the Test: . Be sure to Drink plenty of water. . Do not consume any caffeinated/decaffeinated beverages or chocolate 12 hours prior to your test. . Do not take any antihistamines 12 hours prior to your test.   On the Day of the Test: . Drink plenty of water. Do not drink any water within one hour of the test. . Do not eat any  food 4 hours prior to the test. . You may take your regular medications prior to the test.  . Take metoprolol (Lopressor) two hours prior to test.          After the Test: . Drink plenty of water. . After receiving IV contrast, you may experience a mild flushed feeling. This is normal. . On occasion, you may experience a mild rash up to 24 hours after the test. This is not dangerous. If this occurs, you can take Benadryl 25 mg and increase your fluid intake. . If you experience trouble breathing, this can be serious. If it is severe call 911 IMMEDIATELY. If it is mild, please call our office. . If you take any of these medications: Glipizide/Metformin, Avandament, Glucavance, please do not take 48 hours after completing test unless otherwise instructed.   Once we have confirmed authorization from your insurance company, we will call you to set up a date and time for your test.   For non-scheduling related questions, please contact the cardiac imaging nurse navigator should you have any questions/concerns: Rockwell Alexandria, RN Navigator Cardiac Imaging [facility] [phone] office  For scheduling needs, including cancellations and rescheduling, please call [phone].      Follow-Up: At [facility], you and your health needs are our priority.  As part of our continuing mission to provide you with exceptional heart care, we have created designated Provider Care Teams.  These Care Teams include your primary Cardiologist (physician) and Advanced Practice Providers (APPs -  Physician Assistants and Nurse Practitioners) who all work together to provide you with the care you need, when you need it.  We recommend signing up for the patient portal called "MyChart".  Sign up information is provided on this After Visit Summary.  MyChart is used to connect with patients for Virtual Visits (Telemedicine).  Patients are able to view lab/test results, encounter notes,  upcoming appointments, etc.  Non-urgent messages can be sent to your provider as well.   To learn more about what you can do with MyChart, go to [email or web address].    Your next appointment:   3 month(s)  The format for your next appointment:   In Person  Provider:   Jenne Campus, MD   Other Instructions   Cardiac CT Angiogram A cardiac CT angiogram is a procedure to look at the heart and the area around the heart. It may be done to help find the cause of chest pains or other symptoms of heart disease. During this procedure, a substance called contrast dye is injected into the blood vessels in the area to be checked. A large X-ray machine, called a CT scanner, then takes detailed pictures of the heart and the surrounding area. The procedure is also sometimes called a coronary CT angiogram, coronary artery scanning, or CTA. A cardiac CT angiogram allows the health care provider to see how well blood is flowing to and from the heart. The health care provider will be able to see if there are any problems, such as:  Blockage or narrowing of the coronary arteries in the heart.  Fluid around the heart.  Signs of weakness or disease in the muscles, valves, and tissues of the heart. Tell a health care provider about:  Any allergies you have. This is especially important if you have had a previous allergic reaction to contrast dye.  All medicines you are taking, including vitamins, herbs, eye drops, creams, and over-the-counter medicines.  Any blood disorders you have.  Any surgeries you have had.  Any medical conditions you have.  Whether you are pregnant or may be pregnant.  Any anxiety disorders, chronic pain, or other conditions you have that may increase your stress or prevent you from lying still. What are the risks? Generally, this is a safe procedure. However, problems may occur, including:  Bleeding.  Infection.  Allergic reactions to medicines or  dyes.  Damage to other structures or organs.  Kidney damage from the contrast dye that is used.  Increased risk of cancer from radiation exposure. This risk is low. Talk with your health care provider about: ? The risks and benefits of testing. ? How you can receive the lowest dose of radiation. What happens before the procedure?  Wear comfortable clothing and remove any jewelry, glasses, dentures, and hearing aids.  Follow instructions from your health care provider about eating and drinking. This may include: ? For 12 hours before the procedure -- avoid caffeine. This includes tea, coffee, soda, energy drinks, and diet pills. Drink plenty of water or other fluids that do not have caffeine in them. Being well hydrated can prevent complications. ? For 4-6 hours before the procedure -- stop eating and drinking. The contrast dye can cause nausea, but this is less likely if your stomach is empty.  Ask your health care provider about changing or stopping your regular medicines. This is especially important if you are taking diabetes medicines, blood thinners, or medicines to treat problems with erections (erectile dysfunction). What happens during the procedure?  Hair on your chest may need to be removed so that small sticky patches called electrodes can be placed on your chest. These will transmit information that helps to monitor your heart during the procedure.  An IV will be inserted into one of your veins.  You might be given a medicine to control your heart rate during the procedure. This will help to ensure that good images are obtained.  You will be asked to lie on an exam table. This table will slide in and out of the CT machine during the procedure.  Contrast dye will be injected into the IV. You might feel warm, or you may get a metallic taste in your mouth.  You will be given a medicine called nitroglycerin. This will relax or dilate the arteries in your heart.  The table  that you are lying on will move into the CT machine tunnel for the scan.  The person running the machine will give you instructions while the scans are being done. You may be asked to: ? Keep your arms above your head. ? Hold your breath. ? Stay very still, even if the table is moving.  When the scanning is complete, you will be moved out of the machine.  The IV will be removed. The procedure may vary among health care providers and hospitals. What can I expect after the procedure? After your procedure, it is common to have:  A metallic taste in your mouth from the contrast dye.  A feeling of warmth.  A headache from the nitroglycerin. Follow these instructions at home:  Take over-the-counter and prescription medicines only as told by your health care provider.  If you are told, drink enough fluid to keep your urine pale yellow. This will help to flush the contrast dye out of your body.  Most people can return to their normal activities right after the procedure. Ask your health care provider what activities are safe for you.  It is up to you to get the results of your procedure. Ask your health care provider, or the department that is doing the procedure, when your results will be ready.  Keep all follow-up visits as told by your health care provider. This is important. Contact a health care provider if:  You have any symptoms of allergy to the contrast dye. These include: ? Shortness of breath. ? Rash or hives. ? A racing heartbeat. Summary  A cardiac CT angiogram is a procedure to look at the heart and the area around the heart. It may be done to help find the cause of chest pains or other symptoms of heart disease.  During this procedure, a large X-ray machine, called a CT scanner, takes detailed pictures of the heart and the surrounding area after a contrast dye has been injected into blood vessels in the area.  Ask your health care provider about changing or stopping  your regular medicines before the procedure. This is especially important if you are taking diabetes medicines, blood thinners, or medicines to treat erectile dysfunction.  If you are told, drink enough fluid to keep your urine pale yellow. This will help to flush the contrast dye out of your body. This information is not intended to replace advice given to you by your health care provider. Make sure you discuss any questions you have with your health care provider. Document Revised: 03/01/2019 Document Reviewed: 03/01/2019 Elsevier Patient Education  2020 [employer].

## 2019-11-28 ENCOUNTER — Telehealth: Payer: Self-pay | Admitting: Emergency Medicine

## 2019-11-28 DIAGNOSIS — I2510 Atherosclerotic heart disease of native coronary artery without angina pectoris: Secondary | ICD-10-CM

## 2019-11-28 LAB — COMPREHENSIVE METABOLIC PANEL
ALT: 16 IU/L (ref 0–44)
AST: 19 IU/L (ref 0–40)
Albumin/Globulin Ratio: 1.6 (ref 1.2–2.2)
Albumin: 4.7 g/dL (ref 3.8–4.8)
Alkaline Phosphatase: 100 IU/L (ref 39–117)
BUN/Creatinine Ratio: 9 — ABNORMAL LOW (ref 10–24)
BUN: 9 mg/dL (ref 8–27)
Bilirubin Total: 0.4 mg/dL (ref 0.0–1.2)
CO2: 21 mmol/L (ref 20–29)
Calcium: 9.6 mg/dL (ref 8.6–10.2)
Chloride: 102 mmol/L (ref 96–106)
Creatinine, Ser: 1.05 mg/dL (ref 0.76–1.27)
GFR calc Af Amer: 88 mL/min/{1.73_m2} (ref >59)
GFR calc non Af Amer: 76 mL/min/{1.73_m2} (ref >59)
Globulin, Total: 2.9 g/dL (ref 1.5–4.5)
Glucose: 102 mg/dL — ABNORMAL HIGH (ref 65–99)
Potassium: 4.4 mmol/L (ref 3.5–5.2)
Sodium: 137 mmol/L (ref 134–144)
Total Protein: 7.6 g/dL (ref 6.0–8.5)

## 2019-11-28 NOTE — Telephone Encounter (Signed)
Called patient and informed him if lab results. He reports that he thought Dr. Bing Matter was going to increase his cholesterol medication, will consult with him.

## 2019-11-29 NOTE — Telephone Encounter (Signed)
He is correct :blood test was to check his liver function test if liver function test is okay we need to increase his Lipitor. He was taking 10 mg he need to be taking at least 40 mg daily so please give him prescription for 40 mg daily of Lipitor he did have fasting lipid profile done in about 6 weeks

## 2019-11-29 NOTE — Telephone Encounter (Signed)
Left message for patient to return call.

## 2019-12-04 MED ORDER — ATORVASTATIN CALCIUM 40 MG PO TABS: 40.0000 mg | ORAL_TABLET | Freq: Every day | ORAL | 1 refills | Status: DC

## 2019-12-04 NOTE — Addendum Note (Signed)
Addended by: Lita Mains on: 12/04/2019 04:19 PM   Modules accepted: Orders

## 2019-12-04 NOTE — Telephone Encounter (Signed)
Called patient and informed him of Dr. Vanetta Shawl recommendation to increase lipitor to 40 mg daily and have labs drawn in 6 weeks patient verbally understood. No further questions.

## 2019-12-21 ENCOUNTER — Other Ambulatory Visit (HOSPITAL_COMMUNITY): Payer: Self-pay | Admitting: *Deleted

## 2019-12-21 ENCOUNTER — Telehealth (HOSPITAL_COMMUNITY): Payer: Self-pay | Admitting: *Deleted

## 2019-12-21 MED ORDER — METOPROLOL TARTRATE 100 MG PO TABS: 100.0000 mg | ORAL_TABLET | Freq: Once | ORAL | 0 refills | Status: AC

## 2019-12-21 NOTE — Telephone Encounter (Signed)
Reaching out to patient to offer assistance regarding upcoming cardiac imaging study; pt verbalizes understanding of appt date/time, parking situation and where to check in, pre-test NPO status and medications ordered, and verified current allergies; name and call back number provided for further questions should they arise  Maretta Overdorf Tai RN Navigator Cardiac Imaging [facility] [phone] office [phone] cell 

## 2019-12-22 ENCOUNTER — Ambulatory Visit (HOSPITAL_COMMUNITY): Admission: RE | Admit: 2019-12-22 | Payer: Self-pay | Source: Ambulatory Visit

## 2019-12-25 ENCOUNTER — Telehealth (HOSPITAL_COMMUNITY): Payer: Self-pay | Admitting: *Deleted

## 2019-12-25 NOTE — Telephone Encounter (Signed)
Received a phone call from pt that he will have to cancel his Cardiac CT appointment for 12/26/19.  Pt states that he is moving soon and won't be able to have his hernia surgery.  Therefore he is unsure when he will get this cardiac CT.  Informed pt that the scheduler and Dr. Agustin Cree will be made aware.  Benjamin Jenkins, Cardiac Imaging Nurse Navigator Office: [phone]

## 2019-12-25 NOTE — Telephone Encounter (Signed)
Attempted to call patient regarding upcoming cardiac CT appointment. Left message on voicemail with name and callback number  Pollyann Roa Tai RN Navigator Cardiac Imaging [facility] [phone] Office [phone] Cell 

## 2019-12-26 ENCOUNTER — Other Ambulatory Visit: Payer: Self-pay | Admitting: Vascular Surgery

## 2019-12-26 ENCOUNTER — Ambulatory Visit (HOSPITAL_COMMUNITY): Admission: RE | Admit: 2019-12-26 | Payer: Self-pay | Source: Ambulatory Visit

## 2020-02-19 ENCOUNTER — Other Ambulatory Visit: Payer: Self-pay | Admitting: Vascular Surgery

## 2020-03-13 ENCOUNTER — Ambulatory Visit: Payer: Self-pay | Admitting: Cardiology

## 2020-07-02 ENCOUNTER — Other Ambulatory Visit: Payer: Self-pay | Admitting: Cardiology

## 2021-02-27 ENCOUNTER — Telehealth: Payer: Self-pay | Admitting: *Deleted

## 2021-02-27 NOTE — Telephone Encounter (Signed)
Pt called to report groin swelling and severe pain after sneezing 7 days ago. The swollen area has not grown and the area is not pulsating. When asked if the swollen area was hard or soft the pt stated it was too painful to push on. Advised pt go to the emergency room to be evaluated. Pt verbalized understanding.
